# Patient Record
Sex: Male | Born: 1995 | Race: Black or African American | Hispanic: No | Marital: Single
Health system: Southern US, Community
[De-identification: ages and names within clinical notes are randomized; demographics above are authoritative.]

## PROBLEM LIST (undated history)

## (undated) HISTORY — PX: OTHER SURGICAL HISTORY: SHX169

---
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
## 2016-05-26 ENCOUNTER — Encounter (INDEPENDENT_AMBULATORY_CARE_PROVIDER_SITE_OTHER): Payer: Self-pay | Admitting: Physician Assistant

## 2016-05-26 ENCOUNTER — Ambulatory Visit (INDEPENDENT_AMBULATORY_CARE_PROVIDER_SITE_OTHER): Payer: Self-pay | Admitting: Physician Assistant

## 2016-05-26 ENCOUNTER — Other Ambulatory Visit (INDEPENDENT_AMBULATORY_CARE_PROVIDER_SITE_OTHER): Payer: Self-pay | Admitting: Physician Assistant

## 2016-05-26 VITALS — BP 103/66 | HR 53 | Temp 98.1°F | Ht 65.0 in | Wt 128.6 lb

## 2016-05-26 DIAGNOSIS — Z0000 Encounter for general adult medical examination without abnormal findings: Secondary | ICD-10-CM

## 2016-05-26 NOTE — Progress Notes (Signed)
  Subjective:  Patient ID: Warren Reyes, male    DOB: 1995/08/30  Age: 21 y.o. MRN: [id number]  CC:   HPI Warren Reyes is a 21 y.o. male with no significant PMH presents for a full physical exam. Feels well, want to be proactive about his health. No complaints.     Review of Systems  Constitutional: Negative for chills, fever and malaise/fatigue.  Eyes: Negative for blurred vision.  Respiratory: Negative for shortness of breath.   Cardiovascular: Negative for chest pain and palpitations.  Gastrointestinal: Negative for abdominal pain and nausea.  Genitourinary: Negative for dysuria and hematuria.  Musculoskeletal: Negative for joint pain and myalgias.  Skin: Negative for rash.  Neurological: Negative for tingling and headaches.  Psychiatric/Behavioral: Negative for depression. The patient is not nervous/anxious.     Objective:  BP 103/66   Pulse (!) 53   Temp 98.1 F (36.7 C) (Oral)   Ht  (1.651 m)   Wt 128 lb 9.6 oz (58.3 kg)   SpO2 98%   BMI 21.40 kg/m   BP/Weight 05/26/2016 05/07/2013 09/02/2012  Systolic BP 103 116 112  Diastolic BP 66 69 94  Wt. (Lbs) 128.6 - -  BMI 21.4 - -      Physical Exam  Constitutional: He is oriented to person, place, and time.  Well developed,thin, NAD, polite  HENT:  Head: Normocephalic and atraumatic.  Eyes: Pupils are equal, round, and reactive to light. No scleral icterus.  Neck: Normal range of motion. Neck supple. No thyromegaly present.  Cardiovascular: Normal rate, regular rhythm and normal heart sounds.   Pulmonary/Chest: Effort normal and breath sounds normal.  Abdominal: Soft. Bowel sounds are normal. He exhibits no distension and no mass. There is no tenderness. There is no rebound and no guarding.  Genitourinary: Penis normal.  Genitourinary Comments: Testicles normal, no hernia  Musculoskeletal: He exhibits no edema.  Full aROM throughout.   Lymphadenopathy:    He has no cervical adenopathy.   Neurological: He is alert and oriented to person, place, and time. He has normal reflexes. No cranial nerve deficit. Coordination normal.  Strength 5/5 throughout  Skin: Skin is warm and dry. No rash noted. No erythema. No pallor.  Psychiatric: He has a normal mood and affect. His behavior is normal. Thought content normal.  Vitals reviewed.    Assessment & Plan:   1. Annual physical exam - CBC with Differential - Comprehensive metabolic panel     Follow-up: Return in about 1 year (around 05/26/2017) for physical exam.   Loletta Specter PA

## 2016-05-27 LAB — CBC WITH DIFFERENTIAL/PLATELET
BASOS ABS: 0 10*3/uL (ref 0.0–0.2)
Basos: 1 %
EOS (ABSOLUTE): 0.3 10*3/uL (ref 0.0–0.4)
EOS: 6 %
HEMATOCRIT: 49.4 % (ref 37.5–51.0)
HEMOGLOBIN: 16.9 g/dL (ref 13.0–17.7)
Immature Grans (Abs): 0 10*3/uL (ref 0.0–0.1)
Immature Granulocytes: 0 %
LYMPHS: 29 %
Lymphocytes Absolute: 1.5 10*3/uL (ref 0.7–3.1)
MCH: 29.5 pg (ref 26.6–33.0)
MCHC: 34.2 g/dL (ref 31.5–35.7)
MCV: 86 fL (ref 79–97)
MONOCYTES: 7 %
Monocytes Absolute: 0.4 10*3/uL (ref 0.1–0.9)
NEUTROS PCT: 57 %
Neutrophils Absolute: 2.9 10*3/uL (ref 1.4–7.0)
Platelets: 191 10*3/uL (ref 150–379)
RBC: 5.73 x10E6/uL (ref 4.14–5.80)
RDW: 12.4 % (ref 12.3–15.4)
WBC: 5.1 10*3/uL (ref 3.4–10.8)

## 2016-05-27 LAB — COMPREHENSIVE METABOLIC PANEL
A/G RATIO: 1.6 (ref 1.2–2.2)
ALT: 15 IU/L (ref 0–44)
AST: 19 IU/L (ref 0–40)
Albumin: 5.1 g/dL (ref 3.5–5.5)
Alkaline Phosphatase: 78 IU/L (ref 39–117)
BUN/Creatinine Ratio: 7 — ABNORMAL LOW (ref 9–20)
BUN: 8 mg/dL (ref 6–20)
Bilirubin Total: 0.7 mg/dL (ref 0.0–1.2)
CALCIUM: 9.9 mg/dL (ref 8.7–10.2)
CO2: 25 mmol/L (ref 18–29)
CREATININE: 1.08 mg/dL (ref 0.76–1.27)
Chloride: 101 mmol/L (ref 96–106)
GFR, EST AFRICAN AMERICAN: 112 mL/min/{1.73_m2} (ref >59)
GFR, EST NON AFRICAN AMERICAN: 97 mL/min/{1.73_m2} (ref >59)
GLOBULIN, TOTAL: 3.2 g/dL (ref 1.5–4.5)
GLUCOSE: 90 mg/dL (ref 65–99)
Potassium: 4.1 mmol/L (ref 3.5–5.2)
Sodium: 141 mmol/L (ref 134–144)
Total Protein: 8.3 g/dL (ref 6.0–8.5)

## 2016-07-29 ENCOUNTER — Encounter (HOSPITAL_COMMUNITY): Payer: Self-pay | Admitting: Emergency Medicine

## 2016-07-29 ENCOUNTER — Emergency Department (HOSPITAL_COMMUNITY)
Admission: EM | Admit: 2016-07-29 | Discharge: 2016-07-29 | Disposition: A | Discharge status: Home / Self Care | Payer: Self-pay | Attending: Emergency Medicine | Admitting: Emergency Medicine

## 2016-07-29 DIAGNOSIS — F17290 Nicotine dependence, other tobacco product, uncomplicated: Secondary | ICD-10-CM | POA: Insufficient documentation

## 2016-07-29 DIAGNOSIS — B354 Tinea corporis: Secondary | ICD-10-CM | POA: Insufficient documentation

## 2016-07-29 MED ORDER — HYDROXYZINE HCL 25 MG PO TABS
50.0000 mg | ORAL_TABLET | Freq: Once | ORAL | Status: AC
  Administered 2016-07-29: 50 mg via ORAL
  Filled 2016-07-29: qty 2

## 2016-07-29 MED ORDER — CLOTRIMAZOLE 1 % EX CREA: TOPICAL_CREAM | CUTANEOUS | 2 refills | Status: AC

## 2016-07-29 NOTE — ED Provider Notes (Signed)
[facility]-EMERGENCY DEPT Provider Note   CSN: [id number] Arrival date & time: 07/29/16  1227  By signing my name below, I, Thelma Barge, attest that this documentation has been prepared under the direction and in the presence of [employer], PA-C. Electronically Signed: Thelma Barge, Scribe. 07/29/16. 2:09 PM  History   Chief Complaint Chief Complaint  Patient presents with  . Rash  The history is provided by the patient. No language interpreter was used.    HPI Comments: Warren Reyes is a 21 y.o. male who presents to the Emergency Department complaining of constant, gradually worsening generalized, itchy body rashes that is spreading to his arms for 3 days. He has tried using rubbing alcohol with no relief. He denies being around anyone else with his symptoms.Denies new medications, food, soap, lotions, laundry detergent, pets, N/V, dyspepsia, CP, SOB, wheeze, lip or tongue swelling, fever, h/o anaphylactic reaction.    History reviewed. No pertinent past medical history.  There are no active problems to display for this patient.   Past Surgical History:  Procedure Laterality Date  . extraction of wisdom teeth          Home Medications    Prior to Admission medications   Medication Sig Start Date End Date Taking? Authorizing Provider  clotrimazole (LOTRIMIN) 1 % cream Apply to affected area 2 times daily for at least 4 weeks ( for 1 week until after the lesions have healed) 07/29/16   Giabella Duhart, Joni Reining, PA-C  ibuprofen (ADVIL,MOTRIN) 600 MG tablet Take 1 tablet (600 mg total) by mouth every 8 (eight) hours as needed for pain. Patient not taking: Reported on 05/26/2016 09/02/12   Earley Favor, NP  tretinoin (RETIN-A) 0.025 % cream Apply topically at bedtime. Patient not taking: Reported on 05/26/2016 08/29/12   Sheran Luz, MD    Family History Family History  Problem Relation Age of Onset  . Hypertension Mother   . Diabetes Mother   . Cancer Mother   . Asthma  Mother     Social History Social History  Substance Use Topics  . Smoking status: Current Every Day Smoker    Types: Cigars  . Smokeless tobacco: Never Used  . Alcohol use No     Allergies   Patient has no known allergies.   Review of Systems Review of Systems A complete 10 system review of systems was obtained and all systems are negative except as noted in the HPI and PMH.    Physical Exam Updated Vital Signs BP 123/79 (BP Location: Right Arm)   Pulse 62   Temp 98.8 F (37.1 C) (Oral)   Resp 17   SpO2 100%   Physical Exam  Constitutional: He is oriented to person, place, and time. He appears well-developed and well-nourished. No distress.  HENT:  Head: Normocephalic and atraumatic.  Mouth/Throat: Oropharynx is clear and moist.  Eyes: Conjunctivae and EOM are normal. Pupils are equal, round, and reactive to light.  Neck: Normal range of motion.  Cardiovascular: Normal rate, regular rhythm and intact distal pulses.   Pulmonary/Chest: Effort normal and breath sounds normal.  Abdominal: Soft. There is no tenderness.  Musculoskeletal: Normal range of motion.  Neurological: He is alert and oriented to person, place, and time.  Skin: Skin is warm and dry. Rash noted. He is not diaphoretic.  Raised annular lesions proximally 1 center in diameter scattered over torso and anterior and posterior side if lesions have central clearing. No warmth, tenderness to palpation, lesions are blanchable and spare the  palms soles and mucous membranes.  Psychiatric: He has a normal mood and affect.  Nursing note and vitals reviewed.    ED Treatments / Results  DIAGNOSTIC STUDIES: Oxygen Saturation is 100% on RA, normal by my interpretation.    COORDINATION OF CARE: 2:09 PM Discussed treatment plan with pt at bedside and pt agreed to plan.  Labs (all labs ordered are listed, but only abnormal results are displayed) Labs Reviewed - No data to display  EKG  EKG  Interpretation None       Radiology No results found.  Procedures Procedures (including critical care time)  Medications Ordered in ED Medications  hydrOXYzine (ATARAX/VISTARIL) tablet 50 mg (50 mg Oral Given 07/29/16 1421)     Initial Impression / Assessment and Plan / ED Course  I have reviewed the triage vital signs and the nursing notes.  Pertinent labs & imaging results that were available during my care of the patient were reviewed by me and considered in my medical decision making (see chart for details).     Vitals:   07/29/16 1335  BP: 123/79  Pulse: 62  Resp: 17  Temp: 98.8 F (37.1 C)  TempSrc: Oral  SpO2: 100%    Medications  hydrOXYzine (ATARAX/VISTARIL) tablet 50 mg (50 mg Oral Given 07/29/16 1421)    [name] is 21 y.o. male presenting with pruritic rash consistent with tinea corporis. No red flags.  Evaluation does not show pathology that would require ongoing emergent intervention or inpatient treatment. Pt is hemodynamically stable and mentating appropriately. Discussed findings and plan with patient/guardian, who agrees with care plan. All questions answered. Return precautions discussed and outpatient follow up given.      Final Clinical Impressions(s) / ED Diagnoses   Final diagnoses:  Tinea corporis    New Prescriptions Discharge Medication List as of 07/29/2016  2:11 PM    START taking these medications   Details  clotrimazole (LOTRIMIN) 1 % cream Apply to affected area 2 times daily for at least 4 weeks ( for 1 week until after the lesions have healed), Print       I personally performed the services described in this documentation, which was scribed in my presence. The recorded information has been reviewed and is accurate.    Kaylyn Limisciotta, Lorette Peterkin, PA-C 07/29/16 1644    Arby BarrettePfeiffer, Marcy, MD 08/19/16 [phone]

## 2016-07-29 NOTE — Discharge Instructions (Signed)
Please follow with your primary care doctor in the next 2 days for a check-up. They must obtain records for further management.  ° °Do not hesitate to return to the Emergency Department for any new, worsening or concerning symptoms.  ° °

## 2016-07-29 NOTE — ED Triage Notes (Signed)
patient c/o rash that started couple days ago on torso and has spread to his back, arms and legs. Patient reports that he did use new soap.

## 2016-11-03 ENCOUNTER — Ambulatory Visit (INDEPENDENT_AMBULATORY_CARE_PROVIDER_SITE_OTHER): Payer: Self-pay | Admitting: Physician Assistant

## 2020-09-06 ENCOUNTER — Emergency Department (HOSPITAL_COMMUNITY): Payer: No Typology Code available for payment source

## 2020-09-06 ENCOUNTER — Encounter (HOSPITAL_COMMUNITY): Admission: EM | Disposition: A | Discharge status: Home / Self Care | Payer: Self-pay | Source: Home / Self Care

## 2020-09-06 ENCOUNTER — Emergency Department (HOSPITAL_COMMUNITY): Payer: Self-pay

## 2020-09-06 ENCOUNTER — Emergency Department (HOSPITAL_COMMUNITY): Payer: No Typology Code available for payment source | Admitting: Anesthesiology

## 2020-09-06 ENCOUNTER — Inpatient Hospital Stay (HOSPITAL_COMMUNITY)
Admission: EM | Admit: 2020-09-06 | Discharge: 2020-09-10 | DRG: 957 | Disposition: A | Discharge status: Home / Self Care | Payer: No Typology Code available for payment source | Attending: Surgery | Admitting: Surgery

## 2020-09-06 DIAGNOSIS — Z419 Encounter for procedure for purposes other than remedying health state, unspecified: Secondary | ICD-10-CM

## 2020-09-06 DIAGNOSIS — S0292XA Unspecified fracture of facial bones, initial encounter for closed fracture: Secondary | ICD-10-CM

## 2020-09-06 DIAGNOSIS — S36039A Unspecified laceration of spleen, initial encounter: Secondary | ICD-10-CM

## 2020-09-06 DIAGNOSIS — S2249XA Multiple fractures of ribs, unspecified side, initial encounter for closed fracture: Secondary | ICD-10-CM

## 2020-09-06 DIAGNOSIS — T1490XA Injury, unspecified, initial encounter: Secondary | ICD-10-CM

## 2020-09-06 DIAGNOSIS — T148XXA Other injury of unspecified body region, initial encounter: Secondary | ICD-10-CM

## 2020-09-06 DIAGNOSIS — Z9889 Other specified postprocedural states: Secondary | ICD-10-CM

## 2020-09-06 DIAGNOSIS — S36113A Laceration of liver, unspecified degree, initial encounter: Secondary | ICD-10-CM

## 2020-09-06 DIAGNOSIS — R52 Pain, unspecified: Secondary | ICD-10-CM

## 2020-09-06 DIAGNOSIS — S27329A Contusion of lung, unspecified, initial encounter: Secondary | ICD-10-CM

## 2020-09-06 DIAGNOSIS — T07XXXA Unspecified multiple injuries, initial encounter: Secondary | ICD-10-CM

## 2020-09-06 DIAGNOSIS — S27321A Contusion of lung, unilateral, initial encounter: Secondary | ICD-10-CM | POA: Diagnosis present

## 2020-09-06 DIAGNOSIS — S32412A Displaced fracture of anterior wall of left acetabulum, initial encounter for closed fracture: Secondary | ICD-10-CM | POA: Diagnosis present

## 2020-09-06 DIAGNOSIS — S52502A Unspecified fracture of the lower end of left radius, initial encounter for closed fracture: Secondary | ICD-10-CM | POA: Diagnosis present

## 2020-09-06 DIAGNOSIS — S32592A Other specified fracture of left pubis, initial encounter for closed fracture: Secondary | ICD-10-CM | POA: Diagnosis present

## 2020-09-06 DIAGNOSIS — R40241 Glasgow coma scale score 13-15, unspecified time: Secondary | ICD-10-CM | POA: Diagnosis present

## 2020-09-06 DIAGNOSIS — S0285XA Fracture of orbit, unspecified, initial encounter for closed fracture: Secondary | ICD-10-CM | POA: Diagnosis present

## 2020-09-06 DIAGNOSIS — S3282XA Multiple fractures of pelvis without disruption of pelvic ring, initial encounter for closed fracture: Secondary | ICD-10-CM | POA: Diagnosis present

## 2020-09-06 DIAGNOSIS — S52612A Displaced fracture of left ulna styloid process, initial encounter for closed fracture: Secondary | ICD-10-CM | POA: Diagnosis present

## 2020-09-06 DIAGNOSIS — S2242XA Multiple fractures of ribs, left side, initial encounter for closed fracture: Secondary | ICD-10-CM | POA: Diagnosis present

## 2020-09-06 DIAGNOSIS — S06369A Traumatic hemorrhage of cerebrum, unspecified, with loss of consciousness of unspecified duration, initial encounter: Secondary | ICD-10-CM | POA: Diagnosis present

## 2020-09-06 DIAGNOSIS — S0219XA Other fracture of base of skull, initial encounter for closed fracture: Secondary | ICD-10-CM | POA: Diagnosis present

## 2020-09-06 DIAGNOSIS — Y92410 Unspecified street and highway as the place of occurrence of the external cause: Secondary | ICD-10-CM

## 2020-09-06 DIAGNOSIS — D62 Acute posthemorrhagic anemia: Secondary | ICD-10-CM | POA: Diagnosis present

## 2020-09-06 DIAGNOSIS — Z23 Encounter for immunization: Secondary | ICD-10-CM

## 2020-09-06 DIAGNOSIS — U071 COVID-19: Secondary | ICD-10-CM | POA: Diagnosis present

## 2020-09-06 DIAGNOSIS — I959 Hypotension, unspecified: Secondary | ICD-10-CM | POA: Diagnosis present

## 2020-09-06 DIAGNOSIS — S81812A Laceration without foreign body, left lower leg, initial encounter: Secondary | ICD-10-CM | POA: Diagnosis present

## 2020-09-06 DIAGNOSIS — E876 Hypokalemia: Secondary | ICD-10-CM | POA: Diagnosis not present

## 2020-09-06 DIAGNOSIS — K920 Hematemesis: Secondary | ICD-10-CM | POA: Diagnosis present

## 2020-09-06 DIAGNOSIS — S36032A Major laceration of spleen, initial encounter: Secondary | ICD-10-CM | POA: Diagnosis present

## 2020-09-06 HISTORY — PX: LAPAROTOMY: SHX154

## 2020-09-06 HISTORY — PX: LACERATION REPAIR: SHX5168

## 2020-09-06 LAB — CBC
HCT: 42.8 % (ref 39.0–52.0)
Hemoglobin: 15.1 g/dL (ref 13.0–17.0)
MCH: 30.1 pg (ref 26.0–34.0)
MCHC: 35.3 g/dL (ref 30.0–36.0)
MCV: 85.3 fL (ref 80.0–100.0)
Platelets: UNDETERMINED 10*3/uL (ref 150–400)
RBC: 5.02 MIL/uL (ref 4.22–5.81)
RDW: 11.5 % (ref 11.5–15.5)
WBC: 6 10*3/uL (ref 4.0–10.5)
nRBC: 0.5 % — ABNORMAL HIGH (ref 0.0–0.2)

## 2020-09-06 LAB — PROTIME-INR
INR: 1.2 (ref 0.8–1.2)
Prothrombin Time: 14.9 seconds (ref 11.4–15.2)

## 2020-09-06 LAB — I-STAT CHEM 8, ED
BUN: 21 mg/dL — ABNORMAL HIGH (ref 6–20)
Calcium, Ion: 1.04 mmol/L — ABNORMAL LOW (ref 1.15–1.40)
Chloride: 104 mmol/L (ref 98–111)
Creatinine, Ser: 1.2 mg/dL (ref 0.61–1.24)
Glucose, Bld: 150 mg/dL — ABNORMAL HIGH (ref 70–99)
HCT: 44 % (ref 39.0–52.0)
Hemoglobin: 15 g/dL (ref 13.0–17.0)
Potassium: 3.8 mmol/L (ref 3.5–5.1)
Sodium: 140 mmol/L (ref 135–145)
TCO2: 25 mmol/L (ref 22–32)

## 2020-09-06 LAB — COMPREHENSIVE METABOLIC PANEL
ALT: 58 U/L — ABNORMAL HIGH (ref 0–44)
AST: 101 U/L — ABNORMAL HIGH (ref 15–41)
Albumin: 4 g/dL (ref 3.5–5.0)
Alkaline Phosphatase: 64 U/L (ref 38–126)
Anion gap: 10 (ref 5–15)
BUN: 17 mg/dL (ref 6–20)
CO2: 21 mmol/L — ABNORMAL LOW (ref 22–32)
Calcium: 8.7 mg/dL — ABNORMAL LOW (ref 8.9–10.3)
Chloride: 105 mmol/L (ref 98–111)
Creatinine, Ser: 1.29 mg/dL — ABNORMAL HIGH (ref 0.61–1.24)
GFR, Estimated: >60 mL/min (ref >60)
Glucose, Bld: 155 mg/dL — ABNORMAL HIGH (ref 70–99)
Potassium: 3.9 mmol/L (ref 3.5–5.1)
Sodium: 136 mmol/L (ref 135–145)
Total Bilirubin: 1.1 mg/dL (ref 0.3–1.2)
Total Protein: 7.1 g/dL (ref 6.5–8.1)

## 2020-09-06 LAB — ETHANOL: Alcohol, Ethyl (B): <10 mg/dL (ref <10)

## 2020-09-06 LAB — ABO/RH: ABO/RH(D): A POS

## 2020-09-06 SURGERY — LAPAROTOMY, EXPLORATORY
Anesthesia: General | Site: Leg Lower

## 2020-09-06 MED ORDER — PROPOFOL 10 MG/ML IV BOLUS
INTRAVENOUS | Status: AC
  Filled 2020-09-06: qty 40

## 2020-09-06 MED ORDER — ALBUMIN HUMAN 5 % IV SOLN: INTRAVENOUS | Status: DC | PRN

## 2020-09-06 MED ORDER — LACTATED RINGERS IV SOLN: INTRAVENOUS | Status: DC | PRN

## 2020-09-06 MED ORDER — SODIUM CHLORIDE 0.9 % IV SOLN
INTRAVENOUS | Status: AC | PRN
  Administered 2020-09-06: 999 mL/h via INTRAVENOUS

## 2020-09-06 MED ORDER — MIDAZOLAM HCL 2 MG/2ML IJ SOLN
INTRAMUSCULAR | Status: AC
  Filled 2020-09-06: qty 2

## 2020-09-06 MED ORDER — PROPOFOL 10 MG/ML IV BOLUS
INTRAVENOUS | Status: DC | PRN
  Administered 2020-09-06: 120 mg via INTRAVENOUS

## 2020-09-06 MED ORDER — SODIUM CHLORIDE 0.9 % IV SOLN
INTRAVENOUS | Status: AC
  Filled 2020-09-06: qty 2

## 2020-09-06 MED ORDER — ROCURONIUM BROMIDE 10 MG/ML (PF) SYRINGE
PREFILLED_SYRINGE | INTRAVENOUS | Status: DC | PRN
  Administered 2020-09-06: 50 mg via INTRAVENOUS

## 2020-09-06 MED ORDER — SUCCINYLCHOLINE CHLORIDE 200 MG/10ML IV SOSY
PREFILLED_SYRINGE | INTRAVENOUS | Status: DC | PRN
  Administered 2020-09-06: 120 mg via INTRAVENOUS

## 2020-09-06 MED ORDER — FENTANYL CITRATE (PF) 100 MCG/2ML IJ SOLN
INTRAMUSCULAR | Status: DC | PRN
  Administered 2020-09-06 (×2): 50 ug via INTRAVENOUS
  Administered 2020-09-06: 150 ug via INTRAVENOUS

## 2020-09-06 MED ORDER — SODIUM CHLORIDE 0.9 % IV SOLN
INTRAVENOUS | Status: DC | PRN
  Administered 2020-09-06: 2 g via INTRAVENOUS

## 2020-09-06 MED ORDER — SODIUM CHLORIDE 0.9 % IV SOLN: INTRAVENOUS | Status: DC | PRN

## 2020-09-06 MED ORDER — LIDOCAINE 2% (20 MG/ML) 5 ML SYRINGE
INTRAMUSCULAR | Status: DC | PRN
  Administered 2020-09-06: 40 mg via INTRAVENOUS

## 2020-09-06 MED ORDER — TETANUS-DIPHTH-ACELL PERTUSSIS 5-2.5-18.5 LF-MCG/0.5 IM SUSY
0.5000 mL | PREFILLED_SYRINGE | Freq: Once | INTRAMUSCULAR | Status: AC
  Administered 2020-09-06: 0.5 mL via INTRAMUSCULAR

## 2020-09-06 MED ORDER — IOHEXOL 350 MG/ML SOLN
100.0000 mL | Freq: Once | INTRAVENOUS | Status: AC | PRN
  Administered 2020-09-06: 100 mL via INTRAVENOUS

## 2020-09-06 MED ORDER — FENTANYL CITRATE (PF) 250 MCG/5ML IJ SOLN
INTRAMUSCULAR | Status: AC
  Filled 2020-09-06: qty 5

## 2020-09-06 MED ORDER — 0.9 % SODIUM CHLORIDE (POUR BTL) OPTIME
TOPICAL | Status: DC | PRN
  Administered 2020-09-06: 2000 mL
  Administered 2020-09-06: 1000 mL

## 2020-09-06 SURGICAL SUPPLY — 49 items
APL PRP STRL LF DISP 70% ISPRP (MISCELLANEOUS) ×2
BLADE CLIPPER SURG (BLADE) IMPLANT
BNDG GAUZE ELAST 4 BULKY (GAUZE/BANDAGES/DRESSINGS) ×1 IMPLANT
CANISTER SUCT 3000ML PPV (MISCELLANEOUS) ×3 IMPLANT
CHLORAPREP W/TINT 26 (MISCELLANEOUS) ×3 IMPLANT
COVER SURGICAL LIGHT HANDLE (MISCELLANEOUS) ×3 IMPLANT
DRAPE LAPAROSCOPIC ABDOMINAL (DRAPES) ×3 IMPLANT
DRAPE WARM FLUID 44X44 (DRAPES) ×3 IMPLANT
DRSG COVADERM 4X6 (GAUZE/BANDAGES/DRESSINGS) ×1 IMPLANT
DRSG MEPITEL 4X7.2 (GAUZE/BANDAGES/DRESSINGS) ×1 IMPLANT
DRSG OPSITE POSTOP 4X10 (GAUZE/BANDAGES/DRESSINGS) ×1 IMPLANT
DRSG OPSITE POSTOP 4X8 (GAUZE/BANDAGES/DRESSINGS) IMPLANT
DRSG PAD ABDOMINAL 8X10 ST (GAUZE/BANDAGES/DRESSINGS) ×1 IMPLANT
ELECT BLADE 6.5 EXT (BLADE) IMPLANT
ELECT CAUTERY BLADE 6.4 (BLADE) ×3 IMPLANT
ELECT REM PT RETURN 9FT ADLT (ELECTROSURGICAL) ×3
ELECTRODE REM PT RTRN 9FT ADLT (ELECTROSURGICAL) ×2 IMPLANT
GAUZE SPONGE 4X4 12PLY STRL (GAUZE/BANDAGES/DRESSINGS) ×1 IMPLANT
GLOVE SRG 8 PF TXTR STRL LF DI (GLOVE) ×2 IMPLANT
GLOVE SURG ENC MOIS LTX SZ7.5 (GLOVE) ×3 IMPLANT
GLOVE SURG PR MICRO ENCORE 7 (GLOVE) ×2 IMPLANT
GLOVE SURG UNDER POLY LF SZ6 (GLOVE) ×1 IMPLANT
GLOVE SURG UNDER POLY LF SZ8 (GLOVE) ×6
GOWN STRL REUS W/ TWL LRG LVL3 (GOWN DISPOSABLE) ×2 IMPLANT
GOWN STRL REUS W/ TWL XL LVL3 (GOWN DISPOSABLE) ×2 IMPLANT
GOWN STRL REUS W/TWL LRG LVL3 (GOWN DISPOSABLE) ×3
GOWN STRL REUS W/TWL XL LVL3 (GOWN DISPOSABLE) ×3
HANDLE SUCTION POOLE (INSTRUMENTS) ×2 IMPLANT
KIT BASIN OR (CUSTOM PROCEDURE TRAY) ×3 IMPLANT
KIT TURNOVER KIT B (KITS) ×3 IMPLANT
LIGASURE IMPACT 36 18CM CVD LR (INSTRUMENTS) IMPLANT
NS IRRIG 1000ML POUR BTL (IV SOLUTION) ×6 IMPLANT
PACK GENERAL/GYN (CUSTOM PROCEDURE TRAY) ×3 IMPLANT
PAD ARMBOARD 7.5X6 YLW CONV (MISCELLANEOUS) ×3 IMPLANT
PENCIL SMOKE EVACUATOR (MISCELLANEOUS) ×3 IMPLANT
SPECIMEN JAR LARGE (MISCELLANEOUS) IMPLANT
SPONGE T-LAP 18X18 ~~LOC~~+RFID (SPONGE) ×7 IMPLANT
STAPLER VISISTAT 35W (STAPLE) ×3 IMPLANT
SUCTION POOLE HANDLE (INSTRUMENTS) ×3
SUT ETHILON 3 0 FSL (SUTURE) ×1 IMPLANT
SUT PDS AB 1 TP1 54 (SUTURE) ×6 IMPLANT
SUT PDS AB 1 TP1 96 (SUTURE) ×2 IMPLANT
SUT SILK 2 0 SH CR/8 (SUTURE) ×3 IMPLANT
SUT SILK 2 0 TIES 10X30 (SUTURE) ×3 IMPLANT
SUT SILK 3 0 SH CR/8 (SUTURE) ×3 IMPLANT
SUT SILK 3 0 TIES 10X30 (SUTURE) ×3 IMPLANT
TOWEL GREEN STERILE (TOWEL DISPOSABLE) ×3 IMPLANT
TRAY FOLEY MTR SLVR 16FR STAT (SET/KITS/TRAYS/PACK) ×3 IMPLANT
YANKAUER SUCT BULB TIP NO VENT (SUCTIONS) ×1 IMPLANT

## 2020-09-06 NOTE — ED Notes (Signed)
Patient transported to CT 
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
## 2020-09-06 NOTE — ED Notes (Signed)
Bedside portable XRays

## 2020-09-06 NOTE — Anesthesia Procedure Notes (Signed)
Arterial Line Insertion Start/End7/31/2022 10:55 PM, 09/06/2020 11:00 PM Performed by: Molli Hazard, CRNA, CRNA  Right, radial was placed Catheter size: 20 G Hand hygiene performed  and maximum sterile barriers used   Attempts: 1 Procedure performed without using ultrasound guided technique. Following insertion, Biopatch and dressing applied. Post procedure assessment: normal  Patient tolerated the procedure well with no immediate complications.
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
## 2020-09-06 NOTE — Anesthesia Procedure Notes (Addendum)
Procedure Name: Intubation Date/Time: 09/06/2020 10:48 PM Performed by: Edmonia Caprio, CRNA Pre-anesthesia Checklist: Patient identified, Emergency Drugs available, Suction available and Patient being monitored Patient Re-evaluated:Patient Re-evaluated prior to induction Oxygen Delivery Method: Circle system utilized Preoxygenation: Pre-oxygenation with 100% oxygen Induction Type: IV induction Ventilation: Mask ventilation without difficulty Laryngoscope Size: Glidescope and 3 Grade View: Grade I Tube type: Oral Tube size: 7.5 mm Number of attempts: 1 Airway Equipment and Method: Stylet and Video-laryngoscopy Placement Confirmation: ETT inserted through vocal cords under direct vision, positive ETCO2 and breath sounds checked- equal and bilateral Secured at: 23 cm Tube secured with: Tape Dental Injury: Teeth and Oropharynx as per pre-operative assessment  Difficulty Due To: Difficult Airway- due to reduced neck mobility Comments: C-Collar remained on for induction, intubation and surgery.
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
## 2020-09-07 ENCOUNTER — Encounter (HOSPITAL_COMMUNITY): Payer: Self-pay | Admitting: General Surgery

## 2020-09-07 ENCOUNTER — Inpatient Hospital Stay (HOSPITAL_COMMUNITY): Payer: No Typology Code available for payment source

## 2020-09-07 DIAGNOSIS — Z9889 Other specified postprocedural states: Secondary | ICD-10-CM

## 2020-09-07 LAB — BPAM FFP
Blood Product Expiration Date: 202207312359
Blood Product Expiration Date: 202207312359
Blood Product Expiration Date: 202207312359
Blood Product Expiration Date: 202208042359
Blood Product Expiration Date: 202208062359
Blood Product Expiration Date: 202208072359
ISSUE DATE / TIME: 202207311917
ISSUE DATE / TIME: 202207311943
ISSUE DATE / TIME: 202207312255
ISSUE DATE / TIME: 202207312255
ISSUE DATE / TIME: 202207312255
ISSUE DATE / TIME: 202207312255
Unit Type and Rh: 6200
Unit Type and Rh: 6200
Unit Type and Rh: 6200
Unit Type and Rh: 6200
Unit Type and Rh: 6200
Unit Type and Rh: 6200

## 2020-09-07 LAB — CBC
HCT: 30.1 % — ABNORMAL LOW (ref 39.0–52.0)
Hemoglobin: 10.4 g/dL — ABNORMAL LOW (ref 13.0–17.0)
MCH: 30.5 pg (ref 26.0–34.0)
MCHC: 34.6 g/dL (ref 30.0–36.0)
MCV: 88.3 fL (ref 80.0–100.0)
Platelets: 73 10*3/uL — ABNORMAL LOW (ref 150–400)
RBC: 3.41 MIL/uL — ABNORMAL LOW (ref 4.22–5.81)
RDW: 12.5 % (ref 11.5–15.5)
WBC: 21.7 10*3/uL — ABNORMAL HIGH (ref 4.0–10.5)
nRBC: 0.1 % (ref 0.0–0.2)

## 2020-09-07 LAB — PREPARE FRESH FROZEN PLASMA
Unit division: 0
Unit division: 0
Unit division: 0
Unit division: 0
Unit division: 0

## 2020-09-07 LAB — URINALYSIS, ROUTINE W REFLEX MICROSCOPIC
Bacteria, UA: NONE SEEN
Bilirubin Urine: NEGATIVE
Glucose, UA: NEGATIVE mg/dL
Ketones, ur: 5 mg/dL — AB
Nitrite: NEGATIVE
Protein, ur: NEGATIVE mg/dL
Specific Gravity, Urine: 1.034 — ABNORMAL HIGH (ref 1.005–1.030)
pH: 5 (ref 5.0–8.0)

## 2020-09-07 LAB — BASIC METABOLIC PANEL
Anion gap: 9 (ref 5–15)
BUN: 15 mg/dL (ref 6–20)
CO2: 24 mmol/L (ref 22–32)
Calcium: 7.8 mg/dL — ABNORMAL LOW (ref 8.9–10.3)
Chloride: 108 mmol/L (ref 98–111)
Creatinine, Ser: 1.09 mg/dL (ref 0.61–1.24)
GFR, Estimated: >60 mL/min (ref >60)
Glucose, Bld: 137 mg/dL — ABNORMAL HIGH (ref 70–99)
Potassium: 4.1 mmol/L (ref 3.5–5.1)
Sodium: 141 mmol/L (ref 135–145)

## 2020-09-07 LAB — RESP PANEL BY RT-PCR (FLU A&B, COVID) ARPGX2
Influenza A by PCR: NEGATIVE
Influenza B by PCR: NEGATIVE
SARS Coronavirus 2 by RT PCR: POSITIVE — AB

## 2020-09-07 LAB — POCT I-STAT 7, (LYTES, BLD GAS, ICA,H+H)
Acid-Base Excess: 1 mmol/L (ref 0.0–2.0)
Acid-base deficit: 1 mmol/L (ref 0.0–2.0)
Bicarbonate: 23.8 mmol/L (ref 20.0–28.0)
Bicarbonate: 26 mmol/L (ref 20.0–28.0)
Calcium, Ion: 0.96 mmol/L — ABNORMAL LOW (ref 1.15–1.40)
Calcium, Ion: 0.98 mmol/L — ABNORMAL LOW (ref 1.15–1.40)
HCT: 27 % — ABNORMAL LOW (ref 39.0–52.0)
HCT: 31 % — ABNORMAL LOW (ref 39.0–52.0)
Hemoglobin: 10.5 g/dL — ABNORMAL LOW (ref 13.0–17.0)
Hemoglobin: 9.2 g/dL — ABNORMAL LOW (ref 13.0–17.0)
O2 Saturation: 100 %
O2 Saturation: 100 %
Potassium: 4.3 mmol/L (ref 3.5–5.1)
Potassium: 4.4 mmol/L (ref 3.5–5.1)
Sodium: 140 mmol/L (ref 135–145)
Sodium: 142 mmol/L (ref 135–145)
TCO2: 25 mmol/L (ref 22–32)
TCO2: 27 mmol/L (ref 22–32)
pCO2 arterial: 36.8 mmHg (ref 32.0–48.0)
pCO2 arterial: 44.6 mmHg (ref 32.0–48.0)
pH, Arterial: 7.374 (ref 7.350–7.450)
pH, Arterial: 7.419 (ref 7.350–7.450)
pO2, Arterial: 262 mmHg — ABNORMAL HIGH (ref 83.0–108.0)
pO2, Arterial: 517 mmHg — ABNORMAL HIGH (ref 83.0–108.0)

## 2020-09-07 LAB — HIV ANTIBODY (ROUTINE TESTING W REFLEX): HIV Screen 4th Generation wRfx: NONREACTIVE

## 2020-09-07 LAB — LACTIC ACID, PLASMA: Lactic Acid, Venous: 0.9 mmol/L (ref 0.5–1.9)

## 2020-09-07 LAB — BLOOD PRODUCT ORDER (VERBAL) VERIFICATION

## 2020-09-07 MED ORDER — PROPOFOL 10 MG/ML IV BOLUS
INTRAVENOUS | Status: AC
  Filled 2020-09-07: qty 20

## 2020-09-07 MED ORDER — KETOROLAC TROMETHAMINE 15 MG/ML IJ SOLN
30.0000 mg | Freq: Four times a day (QID) | INTRAMUSCULAR | Status: DC
Start: 2020-09-07 — End: 2020-09-09
  Administered 2020-09-07 – 2020-09-09 (×7): 30 mg via INTRAVENOUS
  Filled 2020-09-07 (×7): qty 2

## 2020-09-07 MED ORDER — METHOCARBAMOL 500 MG PO TABS
1000.0000 mg | ORAL_TABLET | Freq: Three times a day (TID) | ORAL | Status: DC
  Administered 2020-09-07 – 2020-09-10 (×9): 1000 mg via ORAL
  Filled 2020-09-07 (×9): qty 2

## 2020-09-07 MED ORDER — OXYCODONE HCL 5 MG/5ML PO SOLN
5.0000 mg | ORAL | Status: DC | PRN
  Administered 2020-09-08 – 2020-09-10 (×2): 10 mg via ORAL
  Filled 2020-09-07 (×2): qty 10

## 2020-09-07 MED ORDER — HYDROMORPHONE 1 MG/ML IV SOLN
INTRAVENOUS | Status: AC
  Filled 2020-09-07: qty 30

## 2020-09-07 MED ORDER — HYDROMORPHONE HCL 1 MG/ML IJ SOLN: 0.5000 mg | INTRAMUSCULAR | Status: DC | PRN

## 2020-09-07 MED ORDER — DIPHENHYDRAMINE HCL 12.5 MG/5ML PO ELIX
12.5000 mg | ORAL_SOLUTION | Freq: Four times a day (QID) | ORAL | Status: DC | PRN
  Filled 2020-09-07: qty 5

## 2020-09-07 MED ORDER — SODIUM CHLORIDE 0.9 % IV SOLN: INTRAVENOUS | Status: DC

## 2020-09-07 MED ORDER — HYDROMORPHONE HCL 1 MG/ML IJ SOLN
0.2500 mg | INTRAMUSCULAR | Status: DC | PRN
  Administered 2020-09-07 (×2): 0.5 mg via INTRAVENOUS

## 2020-09-07 MED ORDER — WHITE PETROLATUM EX OINT
TOPICAL_OINTMENT | CUTANEOUS | Status: AC
  Administered 2020-09-07: 1
  Filled 2020-09-07: qty 28.35

## 2020-09-07 MED ORDER — CHLORHEXIDINE GLUCONATE CLOTH 2 % EX PADS
6.0000 | MEDICATED_PAD | Freq: Every day | CUTANEOUS | Status: DC
  Administered 2020-09-07 – 2020-09-09 (×3): 6 via TOPICAL

## 2020-09-07 MED ORDER — ENOXAPARIN SODIUM 30 MG/0.3ML IJ SOSY: 30.0000 mg | PREFILLED_SYRINGE | Freq: Two times a day (BID) | INTRAMUSCULAR | Status: DC

## 2020-09-07 MED ORDER — CIPROFLOXACIN-DEXAMETHASONE 0.3-0.1 % OT SUSP
4.0000 [drp] | Freq: Two times a day (BID) | OTIC | Status: DC
  Administered 2020-09-07 – 2020-09-10 (×7): 4 [drp] via OTIC
  Filled 2020-09-07: qty 7.5

## 2020-09-07 MED ORDER — MIDAZOLAM HCL 2 MG/2ML IJ SOLN: 0.5000 mg | Freq: Once | INTRAMUSCULAR | Status: DC | PRN

## 2020-09-07 MED ORDER — OXYCODONE HCL 5 MG/5ML PO SOLN: 5.0000 mg | Freq: Once | ORAL | Status: DC | PRN

## 2020-09-07 MED ORDER — ONDANSETRON 4 MG PO TBDP
4.0000 mg | ORAL_TABLET | Freq: Four times a day (QID) | ORAL | Status: DC | PRN
  Administered 2020-09-10: 4 mg via ORAL
  Filled 2020-09-07: qty 1

## 2020-09-07 MED ORDER — NALOXONE HCL 0.4 MG/ML IJ SOLN: 0.4000 mg | INTRAMUSCULAR | Status: DC | PRN

## 2020-09-07 MED ORDER — HYDROMORPHONE 1 MG/ML IV SOLN
INTRAVENOUS | Status: DC
Start: 2020-09-07 — End: 2020-09-08
  Administered 2020-09-07: 0.3 mg via INTRAVENOUS
  Administered 2020-09-07: 0 mg via INTRAVENOUS
  Administered 2020-09-07: 2.3 mg via INTRAVENOUS
  Administered 2020-09-07: 1.5 mg via INTRAVENOUS
  Administered 2020-09-07: 30 mg via INTRAVENOUS
  Administered 2020-09-07 – 2020-09-08 (×3): 0 mg via INTRAVENOUS

## 2020-09-07 MED ORDER — ONDANSETRON HCL 4 MG/2ML IJ SOLN
INTRAMUSCULAR | Status: DC | PRN
  Administered 2020-09-07: 4 mg via INTRAVENOUS

## 2020-09-07 MED ORDER — PROMETHAZINE HCL 25 MG/ML IJ SOLN: 6.2500 mg | INTRAMUSCULAR | Status: DC | PRN

## 2020-09-07 MED ORDER — SODIUM CHLORIDE 0.9% FLUSH: 9.0000 mL | INTRAVENOUS | Status: DC | PRN

## 2020-09-07 MED ORDER — IOHEXOL 350 MG/ML SOLN
75.0000 mL | Freq: Once | INTRAVENOUS | Status: AC | PRN
  Administered 2020-09-07: 75 mL via INTRAVENOUS

## 2020-09-07 MED ORDER — DIPHENHYDRAMINE HCL 50 MG/ML IJ SOLN: 12.5000 mg | Freq: Four times a day (QID) | INTRAMUSCULAR | Status: DC | PRN

## 2020-09-07 MED ORDER — ONDANSETRON HCL 4 MG/2ML IJ SOLN: 4.0000 mg | Freq: Four times a day (QID) | INTRAMUSCULAR | Status: DC | PRN

## 2020-09-07 MED ORDER — ACETAMINOPHEN 500 MG PO TABS
1000.0000 mg | ORAL_TABLET | Freq: Four times a day (QID) | ORAL | Status: DC
  Administered 2020-09-07 – 2020-09-10 (×11): 1000 mg via ORAL
  Filled 2020-09-07 (×12): qty 2

## 2020-09-07 MED ORDER — OXYCODONE HCL 5 MG PO TABS: 5.0000 mg | ORAL_TABLET | Freq: Once | ORAL | Status: DC | PRN

## 2020-09-07 MED ORDER — DEXTROSE-NACL 5-0.9 % IV SOLN: INTRAVENOUS | Status: DC

## 2020-09-07 MED ORDER — MEPERIDINE HCL 25 MG/ML IJ SOLN: 6.2500 mg | INTRAMUSCULAR | Status: DC | PRN

## 2020-09-07 MED ORDER — HYDROMORPHONE HCL 1 MG/ML IJ SOLN
INTRAMUSCULAR | Status: AC
  Filled 2020-09-07: qty 1

## 2020-09-07 MED ORDER — ONDANSETRON HCL 4 MG/2ML IJ SOLN
4.0000 mg | Freq: Four times a day (QID) | INTRAMUSCULAR | Status: DC | PRN
  Administered 2020-09-07 – 2020-09-09 (×3): 4 mg via INTRAVENOUS
  Filled 2020-09-07 (×3): qty 2

## 2020-09-07 MED ORDER — SUGAMMADEX SODIUM 200 MG/2ML IV SOLN
INTRAVENOUS | Status: DC | PRN
  Administered 2020-09-07: 200 mg via INTRAVENOUS

## 2020-09-07 NOTE — Transfer of Care (Signed)
Immediate Anesthesia Transfer of Care Note  Patient: Francene Castle  Procedure(s) Performed: EXPLORATORY LAPAROTOMY with Splenectomy (Abdomen) LACERATION REPAIR (Left: Leg Lower)  Patient Location: PACU  Anesthesia Type:General  Level of Consciousness: drowsy and responds to stimulation  Airway & Oxygen Therapy: Patient Spontanous Breathing and Patient connected to face mask oxygen  Post-op Assessment: Report given to RN, Post -op Vital signs reviewed and stable and Patient moving all extremities X 4  Post vital signs: Reviewed and stable  Last Vitals:  Vitals Value Taken Time  BP    Temp    Pulse 103 09/07/20 0038  Resp 23 09/07/20 0038  SpO2 100 % 09/07/20 0038  Vitals shown include unvalidated device data.  Last Pain:  Vitals:   09/06/20 2136  TempSrc: Temporal         Complications: No notable events documented.

## 2020-09-07 NOTE — Plan of Care (Signed)
  Problem: Education: Goal: Knowledge of General Education information will improve Description: Including pain rating scale, medication(s)/side effects and non-pharmacologic comfort measures Outcome: Progressing   Problem: Health Behavior/Discharge Planning: Goal: Ability to manage health-related needs will improve Outcome: Progressing   Problem: Clinical Measurements: Goal: Ability to maintain clinical measurements within normal limits will improve Outcome: Progressing Goal: Will remain free from infection Outcome: Progressing Goal: Diagnostic test results will improve Outcome: Progressing Goal: Respiratory complications will improve Outcome: Progressing Goal: Cardiovascular complication will be avoided Outcome: Progressing   Problem: Nutrition: Goal: Adequate nutrition will be maintained Outcome: Progressing   Problem: Coping: Goal: Level of anxiety will decrease Outcome: Progressing   Problem: Elimination: Goal: Will not experience complications related to bowel motility Outcome: Progressing Goal: Will not experience complications related to urinary retention Outcome: Progressing   Problem: Skin Integrity: Goal: Risk for impaired skin integrity will decrease Outcome: Progressing   
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
## 2020-09-07 NOTE — Progress Notes (Signed)
Patients apple watch, ID and wallet given to primary RN on 4N

## 2020-09-07 NOTE — ED Notes (Signed)
Dr. Derrell Lolling notified that on the way up to OR, pt kept repeating the same question, regarding to what was going on. Provider stated he was done in OR and pt will be going to ICU
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
## 2020-09-07 NOTE — Progress Notes (Signed)
Warren Reyes is a 25 y.o. male patient admitted. Awake, alert - oriented  X 4 - no acute distress noted.  VSS - Blood pressure 124/78, pulse 83, temperature 98.6 F (37 C), temperature source Oral, resp. rate 16, height 5\' 10"  (1.778 m), weight 72.6 kg, SpO2 99 %.    IV in place, occlusive dsg intact without redness.  Orientation to room, and floor completed.  Admission INP armband ID verified with patient/family, and in place.   SR up x 2, fall assessment complete, with patient and family able to verbalize understanding of risk associated with falls, and verbalized understanding to call nsg before up out of bed.  Call light within reach, patient able to voice, and demonstrate understanding. No evidence of skin break down noted on exam.      Will cont to eval and treat per MD orders.  , RN 09/07/2020 5:59 PM
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
## 2020-09-08 ENCOUNTER — Inpatient Hospital Stay (HOSPITAL_COMMUNITY): Payer: No Typology Code available for payment source

## 2020-09-08 ENCOUNTER — Inpatient Hospital Stay (HOSPITAL_COMMUNITY): Payer: No Typology Code available for payment source | Admitting: Certified Registered"

## 2020-09-08 ENCOUNTER — Encounter (HOSPITAL_COMMUNITY): Admission: EM | Disposition: A | Discharge status: Home / Self Care | Payer: Self-pay | Source: Home / Self Care

## 2020-09-08 HISTORY — PX: ORIF WRIST FRACTURE: SHX2133

## 2020-09-08 LAB — CBC
HCT: 24.7 % — ABNORMAL LOW (ref 39.0–52.0)
Hemoglobin: 8.3 g/dL — ABNORMAL LOW (ref 13.0–17.0)
MCH: 30.2 pg (ref 26.0–34.0)
MCHC: 33.6 g/dL (ref 30.0–36.0)
MCV: 89.8 fL (ref 80.0–100.0)
Platelets: 155 10*3/uL (ref 150–400)
RBC: 2.75 MIL/uL — ABNORMAL LOW (ref 4.22–5.81)
RDW: 12.5 % (ref 11.5–15.5)
WBC: 13.1 10*3/uL — ABNORMAL HIGH (ref 4.0–10.5)
nRBC: 0.5 % — ABNORMAL HIGH (ref 0.0–0.2)

## 2020-09-08 LAB — BASIC METABOLIC PANEL
Anion gap: 11 (ref 5–15)
BUN: 12 mg/dL (ref 6–20)
CO2: 24 mmol/L (ref 22–32)
Calcium: 8.4 mg/dL — ABNORMAL LOW (ref 8.9–10.3)
Chloride: 105 mmol/L (ref 98–111)
Creatinine, Ser: 0.96 mg/dL (ref 0.61–1.24)
GFR, Estimated: >60 mL/min (ref >60)
Glucose, Bld: 82 mg/dL (ref 70–99)
Potassium: 3.6 mmol/L (ref 3.5–5.1)
Sodium: 140 mmol/L (ref 135–145)

## 2020-09-08 LAB — SURGICAL PATHOLOGY

## 2020-09-08 SURGERY — OPEN REDUCTION INTERNAL FIXATION (ORIF) WRIST FRACTURE
Anesthesia: General | Site: Wrist | Laterality: Left

## 2020-09-08 MED ORDER — MIDAZOLAM HCL 2 MG/2ML IJ SOLN
INTRAMUSCULAR | Status: AC
  Filled 2020-09-08: qty 2

## 2020-09-08 MED ORDER — LACTATED RINGERS IV SOLN: INTRAVENOUS | Status: DC | PRN

## 2020-09-08 MED ORDER — ACETAMINOPHEN 325 MG PO TABS: 325.0000 mg | ORAL_TABLET | ORAL | Status: DC | PRN

## 2020-09-08 MED ORDER — ONDANSETRON HCL 4 MG/2ML IJ SOLN
INTRAMUSCULAR | Status: DC | PRN
  Administered 2020-09-08: 4 mg via INTRAVENOUS

## 2020-09-08 MED ORDER — LIDOCAINE 2% (20 MG/ML) 5 ML SYRINGE
INTRAMUSCULAR | Status: DC | PRN
  Administered 2020-09-08: 100 mg via INTRAVENOUS

## 2020-09-08 MED ORDER — FENTANYL CITRATE (PF) 100 MCG/2ML IJ SOLN: 25.0000 ug | INTRAMUSCULAR | Status: DC | PRN

## 2020-09-08 MED ORDER — MIDAZOLAM HCL 2 MG/2ML IJ SOLN
INTRAMUSCULAR | Status: DC | PRN
  Administered 2020-09-08: 2 mg via INTRAVENOUS

## 2020-09-08 MED ORDER — CEFAZOLIN SODIUM-DEXTROSE 2-4 GM/100ML-% IV SOLN
2.0000 g | Freq: Three times a day (TID) | INTRAVENOUS | Status: AC
  Administered 2020-09-08 – 2020-09-09 (×3): 2 g via INTRAVENOUS
  Filled 2020-09-08 (×3): qty 100

## 2020-09-08 MED ORDER — ASPIRIN 81 MG PO CHEW
81.0000 mg | CHEWABLE_TABLET | Freq: Every day | ORAL | Status: DC
  Administered 2020-09-10: 81 mg via ORAL
  Filled 2020-09-08: qty 1

## 2020-09-08 MED ORDER — DEXMEDETOMIDINE (PRECEDEX) IN NS 20 MCG/5ML (4 MCG/ML) IV SYRINGE
PREFILLED_SYRINGE | INTRAVENOUS | Status: DC | PRN
  Administered 2020-09-08: 20 ug via INTRAVENOUS

## 2020-09-08 MED ORDER — FENTANYL CITRATE (PF) 100 MCG/2ML IJ SOLN
INTRAMUSCULAR | Status: AC
  Filled 2020-09-08: qty 2

## 2020-09-08 MED ORDER — OXYCODONE HCL 5 MG/5ML PO SOLN: 5.0000 mg | Freq: Once | ORAL | Status: DC | PRN

## 2020-09-08 MED ORDER — CEFAZOLIN SODIUM-DEXTROSE 2-4 GM/100ML-% IV SOLN: 2.0000 g | INTRAVENOUS | Status: DC

## 2020-09-08 MED ORDER — HYDROMORPHONE HCL 1 MG/ML IJ SOLN
INTRAMUSCULAR | Status: AC
  Filled 2020-09-08: qty 0.5

## 2020-09-08 MED ORDER — HYDROMORPHONE HCL 1 MG/ML IJ SOLN
INTRAMUSCULAR | Status: DC | PRN
  Administered 2020-09-08: .5 mg via INTRAVENOUS

## 2020-09-08 MED ORDER — PHENYLEPHRINE HCL-NACL 20-0.9 MG/250ML-% IV SOLN
INTRAVENOUS | Status: DC | PRN
  Administered 2020-09-08: 50 ug/min via INTRAVENOUS

## 2020-09-08 MED ORDER — MEPERIDINE HCL 25 MG/ML IJ SOLN: 6.2500 mg | INTRAMUSCULAR | Status: DC | PRN

## 2020-09-08 MED ORDER — SUGAMMADEX SODIUM 200 MG/2ML IV SOLN
INTRAVENOUS | Status: DC | PRN
  Administered 2020-09-08: 200 mg via INTRAVENOUS

## 2020-09-08 MED ORDER — PROPOFOL 10 MG/ML IV BOLUS
INTRAVENOUS | Status: DC | PRN
  Administered 2020-09-08: 150 mg via INTRAVENOUS

## 2020-09-08 MED ORDER — FENTANYL CITRATE (PF) 250 MCG/5ML IJ SOLN
INTRAMUSCULAR | Status: DC | PRN
  Administered 2020-09-08: 50 ug via INTRAVENOUS
  Administered 2020-09-08: 100 ug via INTRAVENOUS
  Administered 2020-09-08 (×2): 50 ug via INTRAVENOUS

## 2020-09-08 MED ORDER — PROPOFOL 10 MG/ML IV BOLUS
INTRAVENOUS | Status: AC
  Filled 2020-09-08: qty 20

## 2020-09-08 MED ORDER — FENTANYL CITRATE (PF) 250 MCG/5ML IJ SOLN
INTRAMUSCULAR | Status: AC
  Filled 2020-09-08: qty 5

## 2020-09-08 MED ORDER — 0.9 % SODIUM CHLORIDE (POUR BTL) OPTIME
TOPICAL | Status: DC | PRN
  Administered 2020-09-08: 1000 mL

## 2020-09-08 MED ORDER — SUCCINYLCHOLINE CHLORIDE 200 MG/10ML IV SOSY
PREFILLED_SYRINGE | INTRAVENOUS | Status: DC | PRN
  Administered 2020-09-08: 160 mg via INTRAVENOUS
  Administered 2020-09-08: 100 mg via INTRAVENOUS

## 2020-09-08 MED ORDER — PHENYLEPHRINE 40 MCG/ML (10ML) SYRINGE FOR IV PUSH (FOR BLOOD PRESSURE SUPPORT)
PREFILLED_SYRINGE | INTRAVENOUS | Status: DC | PRN
  Administered 2020-09-08 (×2): 200 ug via INTRAVENOUS

## 2020-09-08 MED ORDER — CHLORHEXIDINE GLUCONATE 4 % EX LIQD: 60.0000 mL | Freq: Once | CUTANEOUS | Status: DC

## 2020-09-08 MED ORDER — DEXAMETHASONE SODIUM PHOSPHATE 10 MG/ML IJ SOLN
INTRAMUSCULAR | Status: DC | PRN
  Administered 2020-09-08: 10 mg via INTRAVENOUS

## 2020-09-08 MED ORDER — ACETAMINOPHEN 160 MG/5ML PO SOLN: 325.0000 mg | ORAL | Status: DC | PRN

## 2020-09-08 MED ORDER — ONDANSETRON HCL 4 MG/2ML IJ SOLN: 4.0000 mg | Freq: Once | INTRAMUSCULAR | Status: DC | PRN

## 2020-09-08 MED ORDER — POVIDONE-IODINE 10 % EX SWAB: 2.0000 "application " | Freq: Once | CUTANEOUS | Status: DC

## 2020-09-08 MED ORDER — DOCUSATE SODIUM 100 MG PO CAPS
100.0000 mg | ORAL_CAPSULE | Freq: Two times a day (BID) | ORAL | Status: DC
  Administered 2020-09-08 – 2020-09-10 (×5): 100 mg via ORAL
  Filled 2020-09-08 (×5): qty 1

## 2020-09-08 MED ORDER — OXYCODONE HCL 5 MG PO TABS: 5.0000 mg | ORAL_TABLET | Freq: Once | ORAL | Status: DC | PRN

## 2020-09-08 MED ORDER — CEFAZOLIN SODIUM-DEXTROSE 2-3 GM-%(50ML) IV SOLR
INTRAVENOUS | Status: DC | PRN
  Administered 2020-09-08: 2 g via INTRAVENOUS

## 2020-09-08 SURGICAL SUPPLY — 57 items
BAG COUNTER SPONGE SURGICOUNT (BAG) ×2 IMPLANT
BAG SPNG CNTER NS LX DISP (BAG) ×1
BIT DRILL 2.2 SS TIBIAL (BIT) ×1 IMPLANT
BLADE CLIPPER SURG (BLADE) ×2 IMPLANT
BNDG CMPR 9X4 STRL LF SNTH (GAUZE/BANDAGES/DRESSINGS) ×1
BNDG ELASTIC 3X5.8 VLCR STR LF (GAUZE/BANDAGES/DRESSINGS) ×1 IMPLANT
BNDG ELASTIC 4X5.8 VLCR STR LF (GAUZE/BANDAGES/DRESSINGS) ×2 IMPLANT
BNDG ESMARK 4X9 LF (GAUZE/BANDAGES/DRESSINGS) ×2 IMPLANT
BRUSH SCRUB EZ PLAIN DRY (MISCELLANEOUS) ×4 IMPLANT
CORD BIPOLAR FORCEPS 12FT (ELECTRODE) ×1 IMPLANT
COVER SURGICAL LIGHT HANDLE (MISCELLANEOUS) ×2 IMPLANT
DRAPE C-ARM 42X72 X-RAY (DRAPES) ×2 IMPLANT
DRSG EMULSION OIL 3X3 NADH (GAUZE/BANDAGES/DRESSINGS) ×2 IMPLANT
DRSG MEPITEL 4X7.2 (GAUZE/BANDAGES/DRESSINGS) ×1 IMPLANT
ELECT REM PT RETURN 9FT ADLT (ELECTROSURGICAL) ×2
ELECTRODE REM PT RTRN 9FT ADLT (ELECTROSURGICAL) ×1 IMPLANT
GAUZE SPONGE 4X4 12PLY STRL (GAUZE/BANDAGES/DRESSINGS) ×2 IMPLANT
GLOVE SRG 8 PF TXTR STRL LF DI (GLOVE) ×1 IMPLANT
GLOVE SURG ENC MOIS LTX SZ7.5 (GLOVE) ×2 IMPLANT
GLOVE SURG ENC MOIS LTX SZ8 (GLOVE) ×2 IMPLANT
GLOVE SURG UNDER POLY LF SZ7.5 (GLOVE) ×2 IMPLANT
GLOVE SURG UNDER POLY LF SZ8 (GLOVE) ×2
GOWN STRL REUS W/ TWL LRG LVL3 (GOWN DISPOSABLE) ×2 IMPLANT
GOWN STRL REUS W/ TWL XL LVL3 (GOWN DISPOSABLE) ×1 IMPLANT
GOWN STRL REUS W/TWL LRG LVL3 (GOWN DISPOSABLE) ×4
GOWN STRL REUS W/TWL XL LVL3 (GOWN DISPOSABLE) ×2
K-WIRE 1.6 (WIRE) ×6
K-WIRE FX5X1.6XNS BN SS (WIRE) ×3
KIT BASIN OR (CUSTOM PROCEDURE TRAY) ×2 IMPLANT
KIT TURNOVER KIT B (KITS) ×2 IMPLANT
KWIRE FX5X1.6XNS BN SS (WIRE) IMPLANT
NS IRRIG 1000ML POUR BTL (IV SOLUTION) ×2 IMPLANT
PACK ORTHO EXTREMITY (CUSTOM PROCEDURE TRAY) ×2 IMPLANT
PAD ARMBOARD 7.5X6 YLW CONV (MISCELLANEOUS) ×4 IMPLANT
PAD CAST 3X4 CTTN HI CHSV (CAST SUPPLIES) ×1 IMPLANT
PAD CAST 4YDX4 CTTN HI CHSV (CAST SUPPLIES) IMPLANT
PADDING CAST COTTON 3X4 STRL (CAST SUPPLIES) ×2
PADDING CAST COTTON 4X4 STRL (CAST SUPPLIES) ×2
PEG LOCKING SMOOTH 2.2X20 (Screw) ×3 IMPLANT
PEG LOCKING SMOOTH 2.2X22 (Screw) ×2 IMPLANT
PEG LOCKING SMOOTH 2.2X24 (Peg) ×1 IMPLANT
PIN CAPS ORTHO GREEN .062 (PIN) ×1 IMPLANT
PLATE NARROW DVR LEFT (Plate) ×1 IMPLANT
SCREW LOCK 14X2.7X 3 LD TPR (Screw) IMPLANT
SCREW LOCK 16X2.7X 3 LD TPR (Screw) IMPLANT
SCREW LOCKING 2.7X14 (Screw) ×2 IMPLANT
SCREW LOCKING 2.7X16 (Screw) ×6 IMPLANT
SPLINT PLASTER EXTRA FAST 3X15 (CAST SUPPLIES) ×1
SPLINT PLASTER GYPS XFAST 3X15 (CAST SUPPLIES) IMPLANT
SUT ETHILON 3 0 PS 1 (SUTURE) ×4 IMPLANT
SUT VIC AB 0 CT3 27 (SUTURE) ×2 IMPLANT
SUT VIC AB 2-0 CT1 27 (SUTURE) ×4
SUT VIC AB 2-0 CT1 TAPERPNT 27 (SUTURE) IMPLANT
TOWEL GREEN STERILE (TOWEL DISPOSABLE) ×4 IMPLANT
TOWEL GREEN STERILE FF (TOWEL DISPOSABLE) ×2 IMPLANT
TUBE CONNECTING 12X1/4 (SUCTIONS) ×2 IMPLANT
UNDERPAD 30X36 HEAVY ABSORB (UNDERPADS AND DIAPERS) ×2 IMPLANT
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
## 2020-09-08 NOTE — Progress Notes (Signed)
OT Cancellation Note  Patient Details Name: Warren Reyes MRN: [id number] DOB: 19-May-1995   Cancelled Treatment:    Reason Eval/Treat Not Completed: Patient at procedure or test/ unavailable (Pt in OR for L wrist fixation. Will plan to complete OT evaluation tomorrow as schedule allows.)  Lelon Mast A Emmi Wertheim 09/08/2020, 12:47 PM

## 2020-09-08 NOTE — Anesthesia Procedure Notes (Signed)
Procedure Name: Intubation Date/Time: 09/08/2020 12:53 PM Performed by: Rosiland Oz, CRNA Pre-anesthesia Checklist: Patient identified, Emergency Drugs available, Suction available, Patient being monitored and Timeout performed Patient Re-evaluated:Patient Re-evaluated prior to induction Oxygen Delivery Method: Circle system utilized Preoxygenation: Pre-oxygenation with 100% oxygen Induction Type: IV induction and Rapid sequence Laryngoscope Size: Miller and 3 Grade View: Grade I Tube type: Oral Tube size: 7.5 mm Number of attempts: 1 Airway Equipment and Method: Stylet Placement Confirmation: ETT inserted through vocal cords under direct vision, positive ETCO2 and breath sounds checked- equal and bilateral Secured at: 22 cm Tube secured with: Tape Dental Injury: Teeth and Oropharynx as per pre-operative assessment
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
## 2020-09-08 NOTE — Progress Notes (Signed)
Transition of Care [facility]) - CAGE-AID Screening   Patient Details  Name: Warren Reyes MRN: [id number] Date of Birth: 1996/01/31     Clinical Narrative:  Pt denies drug or alcohol use/abuse  CAGE-AID Screening:    Have You Ever Felt You Ought to Cut Down on Your Drinking or Drug Use?: No Have People Annoyed You By [employer] Your Drinking Or Drug Use?: No Have You Felt Bad Or Guilty About Your Drinking Or Drug Use?: No Have You Ever Had a Drink or Used Drugs First Thing In The Morning to Steady Your Nerves or to Get Rid of a Hangover?: No CAGE-AID Score: 0
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
## 2020-09-09 LAB — BASIC METABOLIC PANEL
Anion gap: 7 (ref 5–15)
BUN: 9 mg/dL (ref 6–20)
CO2: 24 mmol/L (ref 22–32)
Calcium: 7.4 mg/dL — ABNORMAL LOW (ref 8.9–10.3)
Chloride: 107 mmol/L (ref 98–111)
Creatinine, Ser: 0.73 mg/dL (ref 0.61–1.24)
GFR, Estimated: >60 mL/min (ref >60)
Glucose, Bld: 117 mg/dL — ABNORMAL HIGH (ref 70–99)
Potassium: 3.4 mmol/L — ABNORMAL LOW (ref 3.5–5.1)
Sodium: 138 mmol/L (ref 135–145)

## 2020-09-09 LAB — CBC
HCT: 20.3 % — ABNORMAL LOW (ref 39.0–52.0)
HCT: 20.9 % — ABNORMAL LOW (ref 39.0–52.0)
Hemoglobin: 7 g/dL — ABNORMAL LOW (ref 13.0–17.0)
Hemoglobin: 7.4 g/dL — ABNORMAL LOW (ref 13.0–17.0)
MCH: 30.3 pg (ref 26.0–34.0)
MCH: 31.4 pg (ref 26.0–34.0)
MCHC: 34.5 g/dL (ref 30.0–36.0)
MCHC: 35.4 g/dL (ref 30.0–36.0)
MCV: 87.9 fL (ref 80.0–100.0)
MCV: 88.6 fL (ref 80.0–100.0)
Platelets: 203 10*3/uL (ref 150–400)
Platelets: 259 10*3/uL (ref 150–400)
RBC: 2.31 MIL/uL — ABNORMAL LOW (ref 4.22–5.81)
RBC: 2.36 MIL/uL — ABNORMAL LOW (ref 4.22–5.81)
RDW: 12.2 % (ref 11.5–15.5)
RDW: 12.2 % (ref 11.5–15.5)
WBC: 10.1 10*3/uL (ref 4.0–10.5)
WBC: 13.9 10*3/uL — ABNORMAL HIGH (ref 4.0–10.5)
nRBC: 0.5 % — ABNORMAL HIGH (ref 0.0–0.2)
nRBC: 0.6 % — ABNORMAL HIGH (ref 0.0–0.2)

## 2020-09-09 MED ORDER — ENOXAPARIN SODIUM 30 MG/0.3ML IJ SOSY
30.0000 mg | PREFILLED_SYRINGE | Freq: Two times a day (BID) | INTRAMUSCULAR | Status: DC
  Administered 2020-09-09 – 2020-09-10 (×2): 30 mg via SUBCUTANEOUS
  Filled 2020-09-09 (×2): qty 0.3

## 2020-09-09 MED ORDER — BACITRACIN ZINC 500 UNIT/GM EX OINT
TOPICAL_OINTMENT | Freq: Two times a day (BID) | CUTANEOUS | Status: DC
  Administered 2020-09-10: 1 via TOPICAL
  Filled 2020-09-09 (×2): qty 28.35

## 2020-09-09 MED ORDER — POLYETHYLENE GLYCOL 3350 17 G PO PACK: 17.0000 g | PACK | Freq: Every day | ORAL | Status: DC | PRN

## 2020-09-09 MED ORDER — LIDOCAINE-PRILOCAINE 2.5-2.5 % EX CREA
TOPICAL_CREAM | Freq: Four times a day (QID) | CUTANEOUS | Status: DC | PRN
  Filled 2020-09-09: qty 5

## 2020-09-09 MED ORDER — POTASSIUM CHLORIDE CRYS ER 20 MEQ PO TBCR
40.0000 meq | EXTENDED_RELEASE_TABLET | Freq: Two times a day (BID) | ORAL | Status: AC
  Administered 2020-09-09 (×2): 40 meq via ORAL
  Filled 2020-09-09 (×2): qty 2
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
## 2020-09-09 NOTE — Transfer of Care (Signed)
Immediate Anesthesia Transfer of Care Note  Patient: Francene Castle  Procedure(s) Performed: OPEN REDUCTION INTERNAL FIXATION (ORIF) WRIST FRACTURE (Left: Wrist)  Patient Location: PACU  Anesthesia Type:General  Level of Consciousness: awake and patient cooperative  Airway & Oxygen Therapy: Patient Spontanous Breathing  Post-op Assessment: Report given to RN and Post -op Vital signs reviewed and stable  Post vital signs: Reviewed and stable  Last Vitals:  Vitals Value Taken Time  BP 173/70 09/09/20 0803  Temp 37.3 C 09/09/20 0803  Pulse 79 09/09/20 0803  Resp 18 09/09/20 0803  SpO2 99 % 09/09/20 0803    Last Pain:  Vitals:   09/09/20 1100  TempSrc:   PainSc: 4          Complications: No notable events documented.

## 2020-09-09 NOTE — Anesthesia Postprocedure Evaluation (Signed)
Anesthesia Post Note  Patient: Francene Castle  Procedure(s) Performed: OPEN REDUCTION INTERNAL FIXATION (ORIF) WRIST FRACTURE (Left: Wrist)     Patient location during evaluation: PACU Anesthesia Type: General Level of consciousness: awake and alert Pain management: pain level controlled Vital Signs Assessment: post-procedure vital signs reviewed and stable Respiratory status: spontaneous breathing, nonlabored ventilation, respiratory function stable and patient connected to nasal cannula oxygen Cardiovascular status: blood pressure returned to baseline and stable Postop Assessment: no apparent nausea or vomiting Anesthetic complications: no   No notable events documented.  Last Vitals:  Vitals:   09/09/20 0423 09/09/20 0803  BP: (!) 175/72 (!) 173/70  Pulse: 89 79  Resp: 19 18  Temp: 37.1 C 37.3 C  SpO2: 100% 99%    Last Pain:  Vitals:   09/09/20 1100  TempSrc:   PainSc: 4                  Jaline Pincock

## 2020-09-09 NOTE — Progress Notes (Signed)
Patient ID: Warren Reyes, male   DOB: 07/10/1995, 25 y.o.   MRN: [id number]  I personally reviewed his maxillofacial CT demonstrating non-displaced left orbitozygomatic complex fracture and skull base fracture through the sphenoid sinus and including a subtle right longitudinal temporal bone fracture.  Injuries remain non-operative.  Continue Ciprodex drops in right ear.  Plan follow-up as outpatient in two weeks.
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
## 2020-09-10 ENCOUNTER — Encounter (HOSPITAL_COMMUNITY): Payer: Self-pay | Admitting: Orthopedic Surgery

## 2020-09-10 ENCOUNTER — Other Ambulatory Visit (HOSPITAL_COMMUNITY): Payer: Self-pay

## 2020-09-10 LAB — TYPE AND SCREEN
ABO/RH(D): A POS
Antibody Screen: NEGATIVE
Unit division: 0
Unit division: 0
Unit division: 0
Unit division: 0
Unit division: 0
Unit division: 0
Unit division: 0
Unit division: 0
Unit division: 0
Unit division: 0

## 2020-09-10 LAB — BPAM RBC
Blood Product Expiration Date: 202208242359
Blood Product Expiration Date: 202208242359
Blood Product Expiration Date: 202208272359
Blood Product Expiration Date: 202208272359
Blood Product Expiration Date: 202208282359
Blood Product Expiration Date: 202208282359
Blood Product Expiration Date: 202208312359
Blood Product Expiration Date: 202208312359
Blood Product Expiration Date: 202208312359
Blood Product Expiration Date: 202208312359
ISSUE DATE / TIME: 202207312211
ISSUE DATE / TIME: 202207312211
ISSUE DATE / TIME: 202208010915
ISSUE DATE / TIME: 202208011023
ISSUE DATE / TIME: 202208011142
ISSUE DATE / TIME: 202208022105
Unit Type and Rh: 5100
Unit Type and Rh: 5100
Unit Type and Rh: 5100
Unit Type and Rh: 5100
Unit Type and Rh: 5100
Unit Type and Rh: 5100
Unit Type and Rh: 6200
Unit Type and Rh: 6200
Unit Type and Rh: 6200
Unit Type and Rh: 6200

## 2020-09-10 LAB — BASIC METABOLIC PANEL
Anion gap: 4 — ABNORMAL LOW (ref 5–15)
BUN: 8 mg/dL (ref 6–20)
CO2: 25 mmol/L (ref 22–32)
Calcium: 8.1 mg/dL — ABNORMAL LOW (ref 8.9–10.3)
Chloride: 107 mmol/L (ref 98–111)
Creatinine, Ser: 0.89 mg/dL (ref 0.61–1.24)
GFR, Estimated: >60 mL/min (ref >60)
Glucose, Bld: 103 mg/dL — ABNORMAL HIGH (ref 70–99)
Potassium: 4 mmol/L (ref 3.5–5.1)
Sodium: 136 mmol/L (ref 135–145)

## 2020-09-10 LAB — CBC
HCT: 21.3 % — ABNORMAL LOW (ref 39.0–52.0)
Hemoglobin: 7.4 g/dL — ABNORMAL LOW (ref 13.0–17.0)
MCH: 30.7 pg (ref 26.0–34.0)
MCHC: 34.7 g/dL (ref 30.0–36.0)
MCV: 88.4 fL (ref 80.0–100.0)
Platelets: 323 10*3/uL (ref 150–400)
RBC: 2.41 MIL/uL — ABNORMAL LOW (ref 4.22–5.81)
RDW: 12 % (ref 11.5–15.5)
WBC: 14 10*3/uL — ABNORMAL HIGH (ref 4.0–10.5)
nRBC: 1 % — ABNORMAL HIGH (ref 0.0–0.2)

## 2020-09-10 LAB — MAGNESIUM: Magnesium: 1.9 mg/dL (ref 1.7–2.4)

## 2020-09-10 MED ORDER — METHOCARBAMOL 500 MG PO TABS
1000.0000 mg | ORAL_TABLET | Freq: Three times a day (TID) | ORAL | 0 refills | Status: AC | PRN
  Filled 2020-09-10: qty 60, 10d supply, fill #0

## 2020-09-10 MED ORDER — POLYETHYLENE GLYCOL 3350 17 G PO PACK
17.0000 g | PACK | Freq: Every day | ORAL | Status: DC
  Administered 2020-09-10: 17 g via ORAL
  Filled 2020-09-10 (×2): qty 1

## 2020-09-10 MED ORDER — DOCUSATE SODIUM 100 MG PO CAPS: 100.0000 mg | ORAL_CAPSULE | Freq: Two times a day (BID) | ORAL | 0 refills | Status: DC | PRN

## 2020-09-10 MED ORDER — ASPIRIN 81 MG PO CHEW
81.0000 mg | CHEWABLE_TABLET | Freq: Every day | ORAL | 0 refills | Status: DC
  Filled 2020-09-10: qty 42, 42d supply, fill #0

## 2020-09-10 MED ORDER — MENINGOCOCCAL A C Y&W-135 OLIG IM SOLR
0.5000 mL | INTRAMUSCULAR | Status: AC | PRN
  Administered 2020-09-10: 0.5 mL via INTRAMUSCULAR
  Filled 2020-09-10: qty 0.5

## 2020-09-10 MED ORDER — OXYCODONE HCL 5 MG PO TABS
5.0000 mg | ORAL_TABLET | Freq: Four times a day (QID) | ORAL | 0 refills | Status: AC | PRN
  Filled 2020-09-10: qty 15, 4d supply, fill #0

## 2020-09-10 MED ORDER — PNEUMOCOCCAL 13-VAL CONJ VACC IM SUSP
0.5000 mL | INTRAMUSCULAR | Status: AC | PRN
  Administered 2020-09-10: 0.5 mL via INTRAMUSCULAR
  Filled 2020-09-10: qty 0.5

## 2020-09-10 MED ORDER — CIPROFLOXACIN-DEXAMETHASONE 0.3-0.1 % OT SUSP
4.0000 [drp] | Freq: Two times a day (BID) | OTIC | 0 refills | Status: DC
  Filled 2020-09-10: qty 7.5, 19d supply, fill #0

## 2020-09-10 MED ORDER — POLYETHYLENE GLYCOL 3350 17 G PO PACK: 17.0000 g | PACK | Freq: Every day | ORAL | 0 refills | Status: DC | PRN

## 2020-09-10 MED ORDER — HAEMOPHILUS B POLYSAC CONJ VAC IM SOLR
0.5000 mL | INTRAMUSCULAR | Status: AC | PRN
  Administered 2020-09-10: 0.5 mL via INTRAMUSCULAR
  Filled 2020-09-10 (×2): qty 0.5

## 2020-09-10 MED ORDER — BACITRACIN ZINC 500 UNIT/GM EX OINT
TOPICAL_OINTMENT | Freq: Two times a day (BID) | CUTANEOUS | 0 refills | Status: AC
  Filled 2020-09-10: qty 28.4, 7d supply, fill #0

## 2020-09-10 MED ORDER — ACETAMINOPHEN 500 MG PO TABS: 1000.0000 mg | ORAL_TABLET | Freq: Three times a day (TID) | ORAL | 0 refills | Status: DC | PRN

## 2020-09-10 MED ORDER — ONDANSETRON 4 MG PO TBDP
4.0000 mg | ORAL_TABLET | Freq: Four times a day (QID) | ORAL | 0 refills | Status: AC | PRN
  Filled 2020-09-10: qty 20, 5d supply, fill #0

## 2020-09-10 NOTE — Progress Notes (Signed)
TOC meds given to pt.

## 2020-09-10 NOTE — Progress Notes (Signed)
Pt ready for DC to home with mom. AVs copy given and explained. 'vaccines given.
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
## 2020-09-10 NOTE — Progress Notes (Signed)
Asked Pharmacist if OK to administer the 3 vaccines with pt being positive for covid, he stated it was [facility].

## 2020-09-10 NOTE — Discharge Instructions (Addendum)
Please remain non-weight bearing to the left wrist. Please follow up with your Orthopedist Splint instructions attached  [facility]      [facility] [phone]  OPEN ABDOMINAL SURGERY: POST OP INSTRUCTIONS  Always review your discharge instruction sheet given to you by the facility where your surgery was performed.  IF YOU HAVE DISABILITY OR FAMILY LEAVE FORMS, YOU MUST BRING THEM TO THE OFFICE FOR PROCESSING.  PLEASE DO NOT GIVE THEM TO YOUR DOCTOR.  A prescription for pain medication may be given to you upon discharge.  Take your pain medication as prescribed, if needed.  If narcotic pain medicine is not needed, then you may take acetaminophen (Tylenol) or ibuprofen (Advil) as needed. Take your usually prescribed medications unless otherwise directed. If you need a refill on your pain medication, please contact your pharmacy. They will contact our office to request authorization.  Prescriptions will not be filled after 5pm or on week-ends. You should follow a light diet the first few days after arrival home, such as soup and crackers, pudding, etc.unless your doctor has advised otherwise. A high-fiber, low fat diet can be resumed as tolerated.   Be sure to include lots of fluids daily. Most patients will experience some swelling and bruising on the chest and neck area.  Ice packs will help.  Swelling and bruising can take several days to resolve Most patients will experience some swelling and bruising in the area of the incision. Ice pack will help. Swelling and bruising can take several days to resolve..  It is common to experience some constipation if taking pain medication after surgery.  Increasing fluid intake and taking a stool softener will usually help or prevent this problem from occurring.  A mild laxative (Milk of Magnesia or Miralax) should be taken according to package directions if there are no bowel movements after 48 hours.  You may have steri-strips (small skin  tapes) in place directly over the incision.  These strips should be left on the skin for 7-10 days.  If your surgeon used skin glue on the incision, you may shower in 24 hours.  The glue will flake off over the next 2-3 weeks.  Any sutures or staples will be removed at the office during your follow-up visit. You may find that a light gauze bandage over your incision may keep your staples from being rubbed or pulled. You may shower and replace the bandage daily. ACTIVITIES:  You may resume regular (light) daily activities beginning the next day--such as daily self-care, walking, climbing stairs--gradually increasing activities as tolerated.  You may have sexual intercourse when it is comfortable.  Refrain from any heavy lifting or straining until approved by your doctor. You may drive when you no longer are taking prescription pain medication, you can comfortably wear a seatbelt, and you can safely maneuver your car and apply brakes Return to Work: ___________________________________ You should see your doctor in the office for a follow-up appointment approximately two weeks after your surgery.  Make sure that you call for this appointment within a day or two after you arrive home to insure a convenient appointment time. OTHER INSTRUCTIONS:  _____________________________________________________________ _____________________________________________________________  WHEN TO CALL YOUR DOCTOR: Fever over 101.0 Inability to urinate Nausea and/or vomiting Extreme swelling or bruising Continued bleeding from incision. Increased pain, redness, or drainage from the incision. Difficulty swallowing or breathing Muscle cramping or spasms. Numbness or tingling in hands or feet or around lips.  The clinic staff is available to answer your   your questions during regular business hours.  Please don't hesitate to call and ask to speak to one of the nurses if you have concerns.  For further questions, please visit  [email or web address]

## 2020-09-11 ENCOUNTER — Encounter (HOSPITAL_COMMUNITY): Payer: Self-pay | Admitting: Emergency Medicine

## 2020-09-14 ENCOUNTER — Other Ambulatory Visit: Payer: Self-pay

## 2020-09-14 ENCOUNTER — Inpatient Hospital Stay (HOSPITAL_COMMUNITY)
Admission: EM | Admit: 2020-09-14 | Discharge: 2020-09-16 | DRG: 948 | Disposition: A | Discharge status: Home / Self Care | Payer: Self-pay | Attending: Surgery | Admitting: Surgery

## 2020-09-14 DIAGNOSIS — D72829 Elevated white blood cell count, unspecified: Secondary | ICD-10-CM | POA: Diagnosis present

## 2020-09-14 DIAGNOSIS — D62 Acute posthemorrhagic anemia: Secondary | ICD-10-CM | POA: Diagnosis present

## 2020-09-14 DIAGNOSIS — K920 Hematemesis: Secondary | ICD-10-CM | POA: Diagnosis present

## 2020-09-14 DIAGNOSIS — Z825 Family history of asthma and other chronic lower respiratory diseases: Secondary | ICD-10-CM

## 2020-09-14 DIAGNOSIS — R1013 Epigastric pain: Secondary | ICD-10-CM

## 2020-09-14 DIAGNOSIS — Z8249 Family history of ischemic heart disease and other diseases of the circulatory system: Secondary | ICD-10-CM

## 2020-09-14 DIAGNOSIS — Z7982 Long term (current) use of aspirin: Secondary | ICD-10-CM

## 2020-09-14 DIAGNOSIS — Z79899 Other long term (current) drug therapy: Secondary | ICD-10-CM

## 2020-09-14 DIAGNOSIS — Z809 Family history of malignant neoplasm, unspecified: Secondary | ICD-10-CM

## 2020-09-14 DIAGNOSIS — Z833 Family history of diabetes mellitus: Secondary | ICD-10-CM

## 2020-09-14 DIAGNOSIS — K5900 Constipation, unspecified: Secondary | ICD-10-CM | POA: Diagnosis present

## 2020-09-14 DIAGNOSIS — Z8673 Personal history of transient ischemic attack (TIA), and cerebral infarction without residual deficits: Secondary | ICD-10-CM

## 2020-09-14 DIAGNOSIS — Z9889 Other specified postprocedural states: Secondary | ICD-10-CM

## 2020-09-14 DIAGNOSIS — K668 Other specified disorders of peritoneum: Secondary | ICD-10-CM

## 2020-09-14 DIAGNOSIS — Z8616 Personal history of COVID-19: Secondary | ICD-10-CM

## 2020-09-14 DIAGNOSIS — G8918 Other acute postprocedural pain: Principal | ICD-10-CM | POA: Diagnosis present

## 2020-09-14 DIAGNOSIS — Z9081 Acquired absence of spleen: Secondary | ICD-10-CM

## 2020-09-14 MED ORDER — SODIUM CHLORIDE 0.9 % IV BOLUS
1000.0000 mL | Freq: Once | INTRAVENOUS | Status: AC
  Administered 2020-09-14: 1000 mL via INTRAVENOUS

## 2020-09-14 MED ORDER — PANTOPRAZOLE SODIUM 40 MG IV SOLR
40.0000 mg | Freq: Once | INTRAVENOUS | Status: AC
  Administered 2020-09-14: 40 mg via INTRAVENOUS
  Filled 2020-09-14: qty 40
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
## 2020-09-15 LAB — BASIC METABOLIC PANEL
Anion gap: 12 (ref 5–15)
Anion gap: 8 (ref 5–15)
BUN: 13 mg/dL (ref 6–20)
BUN: 11 mg/dL (ref 6–20)
CO2: 24 mmol/L (ref 22–32)
CO2: 24 mmol/L (ref 22–32)
Calcium: 9.4 mg/dL (ref 8.9–10.3)
Calcium: 8.8 mg/dL — ABNORMAL LOW (ref 8.9–10.3)
Chloride: 95 mmol/L — ABNORMAL LOW (ref 98–111)
Chloride: 99 mmol/L (ref 98–111)
Creatinine, Ser: 0.8 mg/dL (ref 0.61–1.24)
Creatinine, Ser: 0.92 mg/dL (ref 0.61–1.24)
GFR, Estimated: >60 mL/min (ref >60)
GFR, Estimated: >60 mL/min (ref >60)
Glucose, Bld: 87 mg/dL (ref 70–99)
Glucose, Bld: 103 mg/dL — ABNORMAL HIGH (ref 70–99)
Potassium: 4 mmol/L (ref 3.5–5.1)
Potassium: 4.1 mmol/L (ref 3.5–5.1)
Sodium: 131 mmol/L — ABNORMAL LOW (ref 135–145)
Sodium: 131 mmol/L — ABNORMAL LOW (ref 135–145)

## 2020-09-15 LAB — TYPE AND SCREEN
ABO/RH(D): A POS
Antibody Screen: NEGATIVE

## 2020-09-15 LAB — CBC WITH DIFFERENTIAL/PLATELET
Abs Immature Granulocytes: 0.15 10*3/uL — ABNORMAL HIGH (ref 0.00–0.07)
Basophils Absolute: 0 10*3/uL (ref 0.0–0.1)
Basophils Relative: 0 %
Eosinophils Absolute: 0.2 10*3/uL (ref 0.0–0.5)
Eosinophils Relative: 1 %
HCT: 29.7 % — ABNORMAL LOW (ref 39.0–52.0)
Hemoglobin: 10.8 g/dL — ABNORMAL LOW (ref 13.0–17.0)
Immature Granulocytes: 1 %
Lymphocytes Relative: 9 %
Lymphs Abs: 1.4 10*3/uL (ref 0.7–4.0)
MCH: 34.8 pg — ABNORMAL HIGH (ref 26.0–34.0)
MCHC: 36.4 g/dL — ABNORMAL HIGH (ref 30.0–36.0)
MCV: 95.8 fL (ref 80.0–100.0)
Monocytes Absolute: 1.6 10*3/uL — ABNORMAL HIGH (ref 0.1–1.0)
Monocytes Relative: 11 %
Neutro Abs: 11.4 10*3/uL — ABNORMAL HIGH (ref 1.7–7.7)
Neutrophils Relative %: 78 %
Platelets: 832 10*3/uL — ABNORMAL HIGH (ref 150–400)
RBC: 3.1 MIL/uL — ABNORMAL LOW (ref 4.22–5.81)
RDW: 14.7 % (ref 11.5–15.5)
WBC: 14.8 10*3/uL — ABNORMAL HIGH (ref 4.0–10.5)
nRBC: 0.2 % (ref 0.0–0.2)

## 2020-09-15 LAB — CBC
HCT: 26.4 % — ABNORMAL LOW (ref 39.0–52.0)
Hemoglobin: 9.8 g/dL — ABNORMAL LOW (ref 13.0–17.0)
MCH: 35.3 pg — ABNORMAL HIGH (ref 26.0–34.0)
MCHC: 37.1 g/dL — ABNORMAL HIGH (ref 30.0–36.0)
MCV: 95 fL (ref 80.0–100.0)
Platelets: 801 10*3/uL — ABNORMAL HIGH (ref 150–400)
RBC: 2.78 MIL/uL — ABNORMAL LOW (ref 4.22–5.81)
RDW: 14.1 % (ref 11.5–15.5)
WBC: 13.1 10*3/uL — ABNORMAL HIGH (ref 4.0–10.5)
nRBC: 0.2 % (ref 0.0–0.2)

## 2020-09-15 LAB — RESP PANEL BY RT-PCR (FLU A&B, COVID) ARPGX2
Influenza A by PCR: NEGATIVE
Influenza B by PCR: NEGATIVE
SARS Coronavirus 2 by RT PCR: POSITIVE — AB

## 2020-09-15 LAB — ABO/RH: ABO/RH(D): A POS

## 2020-09-15 MED ORDER — METHOCARBAMOL 500 MG PO TABS
500.0000 mg | ORAL_TABLET | Freq: Four times a day (QID) | ORAL | Status: DC | PRN
  Administered 2020-09-15 – 2020-09-16 (×2): 500 mg via ORAL
  Filled 2020-09-15 (×2): qty 1

## 2020-09-15 MED ORDER — OXYCODONE HCL 5 MG PO TABS
5.0000 mg | ORAL_TABLET | ORAL | Status: DC | PRN
  Administered 2020-09-15 – 2020-09-16 (×4): 5 mg via ORAL
  Filled 2020-09-15 (×2): qty 1
  Filled 2020-09-15: qty 2
  Filled 2020-09-15 (×2): qty 1

## 2020-09-15 MED ORDER — ONDANSETRON 4 MG PO TBDP
4.0000 mg | ORAL_TABLET | Freq: Four times a day (QID) | ORAL | Status: DC | PRN
  Administered 2020-09-15: 4 mg via ORAL
  Filled 2020-09-15: qty 1

## 2020-09-15 MED ORDER — DOCUSATE SODIUM 100 MG PO CAPS
100.0000 mg | ORAL_CAPSULE | Freq: Two times a day (BID) | ORAL | Status: DC
  Administered 2020-09-15 – 2020-09-16 (×3): 100 mg via ORAL
  Filled 2020-09-15 (×2): qty 1

## 2020-09-15 MED ORDER — PANTOPRAZOLE SODIUM 40 MG PO TBEC
40.0000 mg | DELAYED_RELEASE_TABLET | Freq: Every day | ORAL | Status: DC
  Administered 2020-09-15 – 2020-09-16 (×2): 40 mg via ORAL
  Filled 2020-09-15: qty 1

## 2020-09-15 MED ORDER — ASPIRIN 81 MG PO CHEW
81.0000 mg | CHEWABLE_TABLET | Freq: Every day | ORAL | Status: DC
  Administered 2020-09-15 – 2020-09-16 (×2): 81 mg via ORAL
  Filled 2020-09-15 (×2): qty 1

## 2020-09-15 MED ORDER — ONDANSETRON HCL 4 MG/2ML IJ SOLN: 4.0000 mg | Freq: Four times a day (QID) | INTRAMUSCULAR | Status: DC | PRN

## 2020-09-15 MED ORDER — PANTOPRAZOLE SODIUM 40 MG IV SOLR: 40.0000 mg | Freq: Every day | INTRAVENOUS | Status: DC

## 2020-09-15 MED ORDER — TRAMADOL HCL 50 MG PO TABS
50.0000 mg | ORAL_TABLET | Freq: Four times a day (QID) | ORAL | Status: DC | PRN
Start: 2020-09-15 — End: 2020-09-15

## 2020-09-15 MED ORDER — POLYETHYLENE GLYCOL 3350 17 G PO PACK
17.0000 g | PACK | Freq: Two times a day (BID) | ORAL | Status: DC
  Administered 2020-09-15 – 2020-09-16 (×3): 17 g via ORAL
  Filled 2020-09-15 (×3): qty 1

## 2020-09-15 MED ORDER — MORPHINE SULFATE (PF) 2 MG/ML IV SOLN
2.0000 mg | INTRAVENOUS | Status: DC | PRN
  Administered 2020-09-15: 2 mg via INTRAVENOUS
  Filled 2020-09-15: qty 1

## 2020-09-15 MED ORDER — KCL IN DEXTROSE-NACL 40-5-0.9 MEQ/L-%-% IV SOLN
INTRAVENOUS | Status: DC
  Filled 2020-09-15 (×5): qty 1000

## 2020-09-15 MED ORDER — CIPROFLOXACIN-DEXAMETHASONE 0.3-0.1 % OT SUSP
4.0000 [drp] | Freq: Two times a day (BID) | OTIC | Status: DC
  Administered 2020-09-15 – 2020-09-16 (×2): 4 [drp] via OTIC
  Filled 2020-09-15 (×2): qty 7.5

## 2020-09-15 MED ORDER — ACETAMINOPHEN 500 MG PO TABS: 1000.0000 mg | ORAL_TABLET | Freq: Four times a day (QID) | ORAL | Status: DC | PRN

## 2020-09-15 MED ORDER — ENOXAPARIN SODIUM 30 MG/0.3ML IJ SOSY
30.0000 mg | PREFILLED_SYRINGE | Freq: Two times a day (BID) | INTRAMUSCULAR | Status: DC
  Administered 2020-09-16: 30 mg via SUBCUTANEOUS
  Filled 2020-09-15: qty 0.3

## 2020-09-15 NOTE — ED Notes (Signed)
Breakfast Orders Placed °
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
## 2020-09-15 NOTE — ED Notes (Signed)
Pt tolerated clear liquids without n/v.  Denies pain at this time.

## 2020-09-15 NOTE — ED Notes (Signed)
Denies nausea/vomiting, attempting clear liquids this morning

## 2020-09-15 NOTE — ED Notes (Signed)
Attempt to call report to unit, Nurse not available at this time.  Will try again

## 2020-09-15 NOTE — ED Notes (Signed)
2nd attempt to call report to unit, nurse not available at this time, left message and number to return call.

## 2020-09-16 DIAGNOSIS — Z9889 Other specified postprocedural states: Secondary | ICD-10-CM

## 2020-09-16 MED ORDER — POLYETHYLENE GLYCOL 3350 17 G PO PACK: 17.0000 g | PACK | Freq: Two times a day (BID) | ORAL | 0 refills | Status: AC

## 2020-09-16 MED ORDER — DOCUSATE SODIUM 100 MG PO CAPS: 100.0000 mg | ORAL_CAPSULE | Freq: Two times a day (BID) | ORAL | 0 refills | Status: AC

## 2020-09-16 MED ORDER — MAGNESIUM HYDROXIDE 400 MG/5ML PO SUSP
30.0000 mL | Freq: Once | ORAL | Status: AC
  Administered 2020-09-16: 30 mL via ORAL
  Filled 2020-09-16: qty 30

## 2020-09-16 NOTE — Discharge Instructions (Signed)
Please remain non-weight bearing to the left wrist. Please follow up with your Orthopedist Splint instructions attached  CCS      [facility], [place] [phone]  OPEN ABDOMINAL SURGERY: POST OP INSTRUCTIONS  Always review your discharge instruction sheet given to you by the facility where your surgery was performed.  IF YOU HAVE DISABILITY OR FAMILY LEAVE FORMS, YOU MUST BRING THEM TO THE OFFICE FOR PROCESSING.  PLEASE DO NOT GIVE THEM TO YOUR DOCTOR.  A prescription for pain medication may be given to you upon discharge.  Take your pain medication as prescribed, if needed.  If narcotic pain medicine is not needed, then you may take acetaminophen (Tylenol) or ibuprofen (Advil) as needed. Take your usually prescribed medications unless otherwise directed. If you need a refill on your pain medication, please contact your pharmacy. They will contact our office to request authorization.  Prescriptions will not be filled after 5pm or on week-ends. You should follow a light diet the first few days after arrival home, such as soup and crackers, pudding, etc.unless your doctor has advised otherwise. A high-fiber, low fat diet can be resumed as tolerated.   Be sure to include lots of fluids daily. Most patients will experience some swelling and bruising on the chest and neck area.  Ice packs will help.  Swelling and bruising can take several days to resolve Most patients will experience some swelling and bruising in the area of the incision. Ice pack will help. Swelling and bruising can take several days to resolve..  It is common to experience some constipation if taking pain medication after surgery.  Increasing fluid intake and taking a stool softener will usually help or prevent this problem from occurring.  A mild laxative (Milk of Magnesia or Miralax) should be taken according to package directions if there are no bowel movements after 48 hours.  You may have steri-strips (small skin  tapes) in place directly over the incision.  These strips should be left on the skin for 7-10 days.  If your surgeon used skin glue on the incision, you may shower in 24 hours.  The glue will flake off over the next 2-3 weeks.  Any sutures or staples will be removed at the office during your follow-up visit. You may find that a light gauze bandage over your incision may keep your staples from being rubbed or pulled. You may shower and replace the bandage daily. ACTIVITIES:  You may resume regular (light) daily activities beginning the next day--such as daily self-care, walking, climbing stairs--gradually increasing activities as tolerated.  You may have sexual intercourse when it is comfortable.  Refrain from any heavy lifting or straining until approved by your doctor. You may drive when you no longer are taking prescription pain medication, you can comfortably wear a seatbelt, and you can safely maneuver your car and apply brakes Return to Work: ___________________________________ Bonita Quin should see your doctor in the office for a follow-up appointment approximately two weeks after your surgery.  Make sure that you call for this appointment within a day or two after you arrive home to insure a convenient appointment time. OTHER INSTRUCTIONS:  _____________________________________________________________ _____________________________________________________________  WHEN TO CALL YOUR DOCTOR: Fever over 101.0 Inability to urinate Nausea and/or vomiting Extreme swelling or bruising Continued bleeding from incision. Increased pain, redness, or drainage from the incision. Difficulty swallowing or breathing Muscle cramping or spasms. Numbness or tingling in hands or feet or around lips.  The clinic staff is available to answer your  questions during regular business hours.  Please don't hesitate to call and ask to speak to one of the nurses if you have concerns.  For further questions, please visit  [email or web address]
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
## 2020-09-16 NOTE — Progress Notes (Signed)
Warren Reyes to be D/C'd  per MD order.  Discussed with the patient and all questions fully answered.  VSS, Skin clean, dry and intact without evidence of skin break down, no evidence of skin tears noted.  IV catheter discontinued intact. Site without signs and symptoms of complications. Dressing and pressure applied.  An After Visit Summary was printed and given to the patient. Patient received prescription.  D/c education completed with patient/family including follow up instructions, medication list, d/c activities limitations if indicated, with other d/c instructions as indicated by MD - patient able to verbalize understanding, all questions fully answered.   Patient instructed to return to ED, call 911, or call MD for any changes in condition.   Patient to be escorted via WC, and D/C home via private auto.

## 2020-09-16 NOTE — Plan of Care (Signed)

## 2020-09-16 NOTE — Progress Notes (Signed)
Infection prevention recommended to discontinue isolation today because pt tested positive on 7/31, and he is asymptomatic cpvid positive. This nurse contacted the provider Micheal Maczis PA to get verbal order to discontinue isolation.
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
## 2020-09-22 ENCOUNTER — Inpatient Hospital Stay (HOSPITAL_COMMUNITY)
Admission: AD | Admit: 2020-09-22 | Discharge: 2020-09-24 | DRG: 378 | Disposition: A | Discharge status: Home / Self Care | Payer: No Typology Code available for payment source | Source: Other Acute Inpatient Hospital | Attending: Family Medicine | Admitting: Family Medicine

## 2020-09-22 DIAGNOSIS — S0219XD Other fracture of base of skull, subsequent encounter for fracture with routine healing: Secondary | ICD-10-CM

## 2020-09-22 DIAGNOSIS — Z20822 Contact with and (suspected) exposure to covid-19: Secondary | ICD-10-CM | POA: Diagnosis not present

## 2020-09-22 DIAGNOSIS — S0240FD Zygomatic fracture, left side, subsequent encounter for fracture with routine healing: Secondary | ICD-10-CM

## 2020-09-22 DIAGNOSIS — S62102D Fracture of unspecified carpal bone, left wrist, subsequent encounter for fracture with routine healing: Secondary | ICD-10-CM | POA: Diagnosis not present

## 2020-09-22 DIAGNOSIS — T454X5A Adverse effect of iron and its compounds, initial encounter: Secondary | ICD-10-CM | POA: Diagnosis not present

## 2020-09-22 DIAGNOSIS — Z7982 Long term (current) use of aspirin: Secondary | ICD-10-CM

## 2020-09-22 DIAGNOSIS — D62 Acute posthemorrhagic anemia: Secondary | ICD-10-CM | POA: Diagnosis not present

## 2020-09-22 DIAGNOSIS — K2990 Gastroduodenitis, unspecified, without bleeding: Secondary | ICD-10-CM | POA: Diagnosis present

## 2020-09-22 DIAGNOSIS — Z809 Family history of malignant neoplasm, unspecified: Secondary | ICD-10-CM

## 2020-09-22 DIAGNOSIS — Z833 Family history of diabetes mellitus: Secondary | ICD-10-CM | POA: Diagnosis not present

## 2020-09-22 DIAGNOSIS — R55 Syncope and collapse: Secondary | ICD-10-CM | POA: Diagnosis present

## 2020-09-22 DIAGNOSIS — Z825 Family history of asthma and other chronic lower respiratory diseases: Secondary | ICD-10-CM | POA: Diagnosis not present

## 2020-09-22 DIAGNOSIS — S27329D Contusion of lung, unspecified, subsequent encounter: Secondary | ICD-10-CM | POA: Diagnosis not present

## 2020-09-22 DIAGNOSIS — Z8249 Family history of ischemic heart disease and other diseases of the circulatory system: Secondary | ICD-10-CM

## 2020-09-22 DIAGNOSIS — S2249XD Multiple fractures of ribs, unspecified side, subsequent encounter for fracture with routine healing: Secondary | ICD-10-CM

## 2020-09-22 DIAGNOSIS — K922 Gastrointestinal hemorrhage, unspecified: Secondary | ICD-10-CM | POA: Diagnosis present

## 2020-09-22 DIAGNOSIS — S32412D Displaced fracture of anterior wall of left acetabulum, subsequent encounter for fracture with routine healing: Secondary | ICD-10-CM

## 2020-09-22 DIAGNOSIS — K2971 Gastritis, unspecified, with bleeding: Secondary | ICD-10-CM | POA: Diagnosis not present

## 2020-09-22 DIAGNOSIS — Z9081 Acquired absence of spleen: Secondary | ICD-10-CM

## 2020-09-22 DIAGNOSIS — Z79899 Other long term (current) drug therapy: Secondary | ICD-10-CM

## 2020-09-22 DIAGNOSIS — R011 Cardiac murmur, unspecified: Secondary | ICD-10-CM | POA: Diagnosis not present

## 2020-09-22 DIAGNOSIS — K2970 Gastritis, unspecified, without bleeding: Secondary | ICD-10-CM

## 2020-09-22 DIAGNOSIS — K5903 Drug induced constipation: Secondary | ICD-10-CM | POA: Diagnosis present

## 2020-09-22 DIAGNOSIS — Z9889 Other specified postprocedural states: Secondary | ICD-10-CM

## 2020-09-22 LAB — BASIC METABOLIC PANEL
Anion gap: 7 (ref 5–15)
BUN: 10 mg/dL (ref 6–20)
CO2: 24 mmol/L (ref 22–32)
Calcium: 8 mg/dL — ABNORMAL LOW (ref 8.9–10.3)
Chloride: 107 mmol/L (ref 98–111)
Creatinine, Ser: 0.82 mg/dL (ref 0.61–1.24)
GFR, Estimated: >60 mL/min (ref >60)
Glucose, Bld: 110 mg/dL — ABNORMAL HIGH (ref 70–99)
Potassium: 3.5 mmol/L (ref 3.5–5.1)
Sodium: 138 mmol/L (ref 135–145)

## 2020-09-22 LAB — HEMOGLOBIN AND HEMATOCRIT, BLOOD
HCT: 24.3 % — ABNORMAL LOW (ref 39.0–52.0)
Hemoglobin: 8.3 g/dL — ABNORMAL LOW (ref 13.0–17.0)

## 2020-09-22 MED ORDER — ONDANSETRON HCL 4 MG PO TABS: 4.0000 mg | ORAL_TABLET | Freq: Four times a day (QID) | ORAL | Status: DC | PRN

## 2020-09-22 MED ORDER — POLYETHYLENE GLYCOL 3350 17 G PO PACK
17.0000 g | PACK | Freq: Every day | ORAL | Status: DC
  Filled 2020-09-22 (×2): qty 1

## 2020-09-22 MED ORDER — LACTATED RINGERS IV SOLN: INTRAVENOUS | Status: DC

## 2020-09-22 MED ORDER — PANTOPRAZOLE SODIUM 40 MG IV SOLR
40.0000 mg | Freq: Two times a day (BID) | INTRAVENOUS | Status: DC
  Administered 2020-09-22 – 2020-09-23 (×2): 40 mg via INTRAVENOUS
  Filled 2020-09-22 (×2): qty 40

## 2020-09-22 MED ORDER — FENTANYL CITRATE (PF) 100 MCG/2ML IJ SOLN: 12.5000 ug | INTRAMUSCULAR | Status: DC | PRN

## 2020-09-22 MED ORDER — OXYCODONE HCL 5 MG PO TABS
5.0000 mg | ORAL_TABLET | Freq: Four times a day (QID) | ORAL | Status: DC | PRN
Start: 2020-09-22 — End: 2020-09-24
  Administered 2020-09-23 – 2020-09-24 (×2): 5 mg via ORAL
  Filled 2020-09-22 (×2): qty 1

## 2020-09-22 MED ORDER — ONDANSETRON HCL 4 MG/2ML IJ SOLN: 4.0000 mg | Freq: Four times a day (QID) | INTRAMUSCULAR | Status: DC | PRN

## 2020-09-22 MED ORDER — BACITRACIN ZINC 500 UNIT/GM EX OINT
TOPICAL_OINTMENT | Freq: Two times a day (BID) | CUTANEOUS | Status: DC
  Administered 2020-09-23: 1 via TOPICAL
  Filled 2020-09-22: qty 28.4

## 2020-09-22 MED ORDER — FERROUS SULFATE 325 (65 FE) MG PO TABS
325.0000 mg | ORAL_TABLET | Freq: Three times a day (TID) | ORAL | Status: DC
  Administered 2020-09-23 (×2): 325 mg via ORAL
  Filled 2020-09-22 (×4): qty 1

## 2020-09-22 MED ORDER — ASCORBIC ACID 500 MG PO TABS
500.0000 mg | ORAL_TABLET | Freq: Three times a day (TID) | ORAL | Status: DC
  Administered 2020-09-22 – 2020-09-24 (×4): 500 mg via ORAL
  Filled 2020-09-22 (×5): qty 1

## 2020-09-22 MED ORDER — METHOCARBAMOL 500 MG PO TABS: 1000.0000 mg | ORAL_TABLET | Freq: Three times a day (TID) | ORAL | Status: DC | PRN

## 2020-09-22 MED ORDER — DOCUSATE SODIUM 100 MG PO CAPS
100.0000 mg | ORAL_CAPSULE | Freq: Two times a day (BID) | ORAL | Status: DC
  Administered 2020-09-23 – 2020-09-24 (×2): 100 mg via ORAL
  Filled 2020-09-22 (×2): qty 1

## 2020-09-22 NOTE — Progress Notes (Signed)
Contacted infection prevention and they cleared pt to come off of isolation on 09/22/20 @ 1708.
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
## 2020-09-22 NOTE — Progress Notes (Signed)
Transition of Care [facility]) - CAGE-AID Screening   Patient Details  Name: Warren Reyes MRN: [id number] Date of Birth: [birth year]   Clinical Narrative:  Pt denies drug and alcohol use.   CAGE-AID Screening:    Have You Ever Felt You Ought to Cut Down on Your Drinking or Drug Use?: No Have People Annoyed You By Critizing Your Drinking Or Drug Use?: No Have You Felt Bad Or Guilty About Your Drinking Or Drug Use?: No Have You Ever Had a Drink or Used Drugs First Thing In The Morning to Steady Your Nerves or to Get Rid of a Hangover?: No CAGE-AID Score: 0  Substance Abuse Education Offered: No
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
## 2020-09-23 ENCOUNTER — Encounter (HOSPITAL_COMMUNITY): Payer: Self-pay | Admitting: Internal Medicine

## 2020-09-23 ENCOUNTER — Observation Stay (HOSPITAL_COMMUNITY): Payer: No Typology Code available for payment source | Admitting: Anesthesiology

## 2020-09-23 ENCOUNTER — Encounter (HOSPITAL_COMMUNITY)
Admission: AD | Disposition: A | Discharge status: Home / Self Care | Payer: Self-pay | Source: Other Acute Inpatient Hospital | Attending: Family Medicine

## 2020-09-23 DIAGNOSIS — S0240FD Zygomatic fracture, left side, subsequent encounter for fracture with routine healing: Secondary | ICD-10-CM | POA: Diagnosis not present

## 2020-09-23 DIAGNOSIS — S27329D Contusion of lung, unspecified, subsequent encounter: Secondary | ICD-10-CM | POA: Diagnosis not present

## 2020-09-23 DIAGNOSIS — Z7982 Long term (current) use of aspirin: Secondary | ICD-10-CM | POA: Diagnosis not present

## 2020-09-23 DIAGNOSIS — K5903 Drug induced constipation: Secondary | ICD-10-CM | POA: Diagnosis present

## 2020-09-23 DIAGNOSIS — K2970 Gastritis, unspecified, without bleeding: Secondary | ICD-10-CM

## 2020-09-23 DIAGNOSIS — K2961 Other gastritis with bleeding: Secondary | ICD-10-CM

## 2020-09-23 DIAGNOSIS — T454X5A Adverse effect of iron and its compounds, initial encounter: Secondary | ICD-10-CM | POA: Diagnosis present

## 2020-09-23 DIAGNOSIS — R55 Syncope and collapse: Secondary | ICD-10-CM | POA: Diagnosis present

## 2020-09-23 DIAGNOSIS — Z20822 Contact with and (suspected) exposure to covid-19: Secondary | ICD-10-CM | POA: Diagnosis present

## 2020-09-23 DIAGNOSIS — R011 Cardiac murmur, unspecified: Secondary | ICD-10-CM | POA: Diagnosis present

## 2020-09-23 DIAGNOSIS — Z8249 Family history of ischemic heart disease and other diseases of the circulatory system: Secondary | ICD-10-CM | POA: Diagnosis not present

## 2020-09-23 DIAGNOSIS — S62102D Fracture of unspecified carpal bone, left wrist, subsequent encounter for fracture with routine healing: Secondary | ICD-10-CM | POA: Diagnosis not present

## 2020-09-23 DIAGNOSIS — S32412D Displaced fracture of anterior wall of left acetabulum, subsequent encounter for fracture with routine healing: Secondary | ICD-10-CM | POA: Diagnosis not present

## 2020-09-23 DIAGNOSIS — D62 Acute posthemorrhagic anemia: Secondary | ICD-10-CM | POA: Diagnosis present

## 2020-09-23 DIAGNOSIS — Z833 Family history of diabetes mellitus: Secondary | ICD-10-CM | POA: Diagnosis not present

## 2020-09-23 DIAGNOSIS — S2249XD Multiple fractures of ribs, unspecified side, subsequent encounter for fracture with routine healing: Secondary | ICD-10-CM | POA: Diagnosis not present

## 2020-09-23 DIAGNOSIS — K2990 Gastroduodenitis, unspecified, without bleeding: Secondary | ICD-10-CM | POA: Diagnosis present

## 2020-09-23 DIAGNOSIS — Z9081 Acquired absence of spleen: Secondary | ICD-10-CM | POA: Diagnosis not present

## 2020-09-23 DIAGNOSIS — Z825 Family history of asthma and other chronic lower respiratory diseases: Secondary | ICD-10-CM | POA: Diagnosis not present

## 2020-09-23 DIAGNOSIS — K921 Melena: Secondary | ICD-10-CM

## 2020-09-23 DIAGNOSIS — Z79899 Other long term (current) drug therapy: Secondary | ICD-10-CM | POA: Diagnosis not present

## 2020-09-23 DIAGNOSIS — Z809 Family history of malignant neoplasm, unspecified: Secondary | ICD-10-CM | POA: Diagnosis not present

## 2020-09-23 DIAGNOSIS — S0219XD Other fracture of base of skull, subsequent encounter for fracture with routine healing: Secondary | ICD-10-CM | POA: Diagnosis not present

## 2020-09-23 DIAGNOSIS — K922 Gastrointestinal hemorrhage, unspecified: Secondary | ICD-10-CM

## 2020-09-23 DIAGNOSIS — K2971 Gastritis, unspecified, with bleeding: Secondary | ICD-10-CM | POA: Diagnosis present

## 2020-09-23 HISTORY — PX: BIOPSY: SHX5522

## 2020-09-23 HISTORY — PX: ESOPHAGOGASTRODUODENOSCOPY: SHX5428

## 2020-09-23 LAB — HEMOGLOBIN AND HEMATOCRIT, BLOOD
HCT: 22.2 % — ABNORMAL LOW (ref 39.0–52.0)
HCT: 21.6 % — ABNORMAL LOW (ref 39.0–52.0)
Hemoglobin: 7.4 g/dL — ABNORMAL LOW (ref 13.0–17.0)
Hemoglobin: 7.3 g/dL — ABNORMAL LOW (ref 13.0–17.0)

## 2020-09-23 LAB — BASIC METABOLIC PANEL
Anion gap: 6 (ref 5–15)
BUN: 10 mg/dL (ref 6–20)
CO2: 24 mmol/L (ref 22–32)
Calcium: 8.4 mg/dL — ABNORMAL LOW (ref 8.9–10.3)
Chloride: 108 mmol/L (ref 98–111)
Creatinine, Ser: 0.75 mg/dL (ref 0.61–1.24)
GFR, Estimated: >60 mL/min (ref >60)
Glucose, Bld: 94 mg/dL (ref 70–99)
Potassium: 3.8 mmol/L (ref 3.5–5.1)
Sodium: 138 mmol/L (ref 135–145)

## 2020-09-23 SURGERY — EGD (ESOPHAGOGASTRODUODENOSCOPY)
Anesthesia: Monitor Anesthesia Care

## 2020-09-23 MED ORDER — PROPOFOL 500 MG/50ML IV EMUL
INTRAVENOUS | Status: DC | PRN
  Administered 2020-09-23: 200 ug/kg/min via INTRAVENOUS

## 2020-09-23 MED ORDER — LACTATED RINGERS IV SOLN: INTRAVENOUS | Status: DC | PRN

## 2020-09-23 MED ORDER — LIDOCAINE 2% (20 MG/ML) 5 ML SYRINGE
INTRAMUSCULAR | Status: DC | PRN
  Administered 2020-09-23: 100 mg via INTRAVENOUS

## 2020-09-23 MED ORDER — WHITE PETROLATUM EX OINT
TOPICAL_OINTMENT | CUTANEOUS | Status: AC
  Filled 2020-09-23: qty 28.35

## 2020-09-23 MED ORDER — PROPOFOL 10 MG/ML IV BOLUS
INTRAVENOUS | Status: DC | PRN
  Administered 2020-09-23: 50 mg via INTRAVENOUS

## 2020-09-23 MED ORDER — PANTOPRAZOLE SODIUM 40 MG PO TBEC
40.0000 mg | DELAYED_RELEASE_TABLET | Freq: Every day | ORAL | Status: DC
  Administered 2020-09-23 – 2020-09-24 (×2): 40 mg via ORAL
  Filled 2020-09-23 (×2): qty 1

## 2020-09-23 MED ORDER — SODIUM CHLORIDE 0.9 % IV SOLN: INTRAVENOUS | Status: DC

## 2020-09-23 MED ORDER — SUCRALFATE 1 GM/10ML PO SUSP
1.0000 g | Freq: Three times a day (TID) | ORAL | Status: DC
  Administered 2020-09-23 – 2020-09-24 (×2): 1 g via ORAL
  Filled 2020-09-23 (×5): qty 10

## 2020-09-23 MED ORDER — PHENYLEPHRINE HCL (PRESSORS) 10 MG/ML IV SOLN
INTRAVENOUS | Status: DC | PRN
  Administered 2020-09-23 (×2): 40 ug via INTRAVENOUS

## 2020-09-23 NOTE — Anesthesia Postprocedure Evaluation (Signed)
Anesthesia Post Note  Patient: Warren Reyes  Procedure(s) Performed: ESOPHAGOGASTRODUODENOSCOPY (EGD) BIOPSY     Patient location during evaluation: PACU Anesthesia Type: MAC Level of consciousness: awake and alert Pain management: pain level controlled Vital Signs Assessment: post-procedure vital signs reviewed and stable Respiratory status: spontaneous breathing, nonlabored ventilation, respiratory function stable and patient connected to nasal cannula oxygen Cardiovascular status: stable and blood pressure returned to baseline Postop Assessment: no apparent nausea or vomiting Anesthetic complications: no   No notable events documented.  Last Vitals:  Vitals:   09/23/20 1558 09/23/20 1610  BP: 114/67 (!) 118/52  Pulse: 84 77  Resp: 19 13  Temp:    SpO2: 100% 100%    Last Pain:  Vitals:   09/23/20 1610  TempSrc:   PainSc: 0-No pain                 [name]
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
## 2020-09-23 NOTE — Op Note (Signed)
[facility] Patient Name: Warren Reyes Procedure Date : 09/23/2020 MRN: [id number] Attending MD: Dub Amis. Tomasa Rand , MD Date of Birth: 06-Apr-1995 CSN: [id number] Age: 25 Admit Type: Inpatient Procedure:                Upper GI endoscopy Indications:              Acute post hemorrhagic anemia, Suspected upper                            gastrointestinal bleeding Providers:                Lorin Picket E. Tomasa Rand, MD, Roselie Awkward, RN, Alan Ripper, Technician, Loma Sender, CRNA Referring MD:              Medicines:                Monitored Anesthesia Care Complications:            No immediate complications. Estimated Blood Loss:     Estimated blood loss was minimal. Procedure:                Pre-Anesthesia Assessment:                           - Prior to the procedure, a History and Physical                            was performed, and patient medications and                            allergies were reviewed. The patient's tolerance of                            previous anesthesia was also reviewed. The risks                            and benefits of the procedure and the sedation                            options and risks were discussed with the patient.                            All questions were answered, and informed consent                            was obtained. Prior Anticoagulants: The patient has                            taken no previous anticoagulant or antiplatelet                            agents. ASA Grade Assessment: III - A patient with  severe systemic disease. After reviewing the risks                            and benefits, the patient was deemed in                            satisfactory condition to undergo the procedure.                           After obtaining informed consent, the endoscope was                            passed under direct vision. Throughout the                             procedure, the patient's blood pressure, pulse, and                            oxygen saturations were monitored continuously. The                            [id number]-H190 ([id number] Olympus endoscope was introduced                            through the mouth, and advanced to the third part                            of duodenum. The upper GI endoscopy was                            accomplished without difficulty. The patient                            tolerated the procedure well. Scope In: Scope Out: Findings:      The examined esophagus was normal.      Localized moderate inflammation with hemorrhage characterized by       erythema and friability (with contact bleeding from water lavage) was       found in the gastric body. Biopsies were taken with a cold forceps for       histology. Estimated blood loss was minimal.      The exam of the stomach was otherwise normal.      The examined duodenum was normal. Impression:               - Normal esophagus.                           - Presumed caustic gastritis with hemorrhage.                            Biopsied. Query history of oral iron                            supplementation or other gastric irritant. No high  risk lesions present                           - Normal examined duodenum. Recommendation:           - Return patient to hospital ward for ongoing care.                           - Resume previous diet.                           - Continue present medications.                           - Await pathology results.                           - Use sucralfate suspension 1 gram PO QID for 1                            week.                           - Recommend PPI once daily for 4 weeks.                           - Recommend observation overnight and if no further                            evidence bleeding, can discharge home. Procedure Code(s):        --- Professional ---                            [phone], Esophagogastroduodenoscopy, flexible,                            transoral; with biopsy, single or multiple Diagnosis Code(s):        --- Professional ---                           D62, Acute posthemorrhagic anemia CPT copyright 2019 American Medical Association. All rights reserved. The codes documented in this report are preliminary and upon coder review may  be revised to meet current compliance requirements. Teddy Rebstock E. Tomasa Rand, MD 09/23/2020 3:52:53 PM This report has been signed electronically. Number of Addenda: 0

## 2020-09-23 NOTE — Progress Notes (Signed)
PT Cancellation Note  Patient Details Name: Warren Reyes MRN: [id number] DOB: 1995/06/02   Cancelled Treatment:    Reason Eval/Treat Not Completed: Patient at procedure or test/unavailable (upper endoscopy).  Lillia Pauls, PT, DPT Acute Rehabilitation Services Pager [phone] Office [phone]    Norval Morton 09/23/2020, 1:59 PM
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
## 2020-09-23 NOTE — Transfer of Care (Signed)
Immediate Anesthesia Transfer of Care Note  Patient: Warren Reyes  Procedure(s) Performed: ESOPHAGOGASTRODUODENOSCOPY (EGD) BIOPSY  Patient Location: PACU  Anesthesia Type:MAC  Level of Consciousness: drowsy and patient cooperative  Airway & Oxygen Therapy: Patient Spontanous Breathing  Post-op Assessment: Report given to RN and Post -op Vital signs reviewed and stable  Post vital signs: Reviewed and stable  Last Vitals:  Vitals Value Taken Time  BP 111/51 09/23/20 1550  Temp 36.9 C 09/23/20 1549  Pulse 80 09/23/20 1555  Resp 18 09/23/20 1555  SpO2 100 % 09/23/20 1555  Vitals shown include unvalidated device data.  Last Pain:  Vitals:   09/23/20 1549  TempSrc: Temporal  PainSc: 0-No pain         Complications: No notable events documented.
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
## 2020-09-24 ENCOUNTER — Encounter (HOSPITAL_COMMUNITY): Payer: Self-pay | Admitting: Gastroenterology

## 2020-09-24 ENCOUNTER — Other Ambulatory Visit (HOSPITAL_COMMUNITY): Payer: Self-pay

## 2020-09-24 LAB — CBC
HCT: 24.3 % — ABNORMAL LOW (ref 39.0–52.0)
Hemoglobin: 8 g/dL — ABNORMAL LOW (ref 13.0–17.0)
MCH: 28.3 pg (ref 26.0–34.0)
MCHC: 32.9 g/dL (ref 30.0–36.0)
MCV: 85.9 fL (ref 80.0–100.0)
Platelets: 473 10*3/uL — ABNORMAL HIGH (ref 150–400)
RBC: 2.83 MIL/uL — ABNORMAL LOW (ref 4.22–5.81)
RDW: 16.8 % — ABNORMAL HIGH (ref 11.5–15.5)
WBC: 9.8 10*3/uL (ref 4.0–10.5)
nRBC: 0.9 % — ABNORMAL HIGH (ref 0.0–0.2)

## 2020-09-24 LAB — RETICULOCYTES
Immature Retic Fract: 37.6 % — ABNORMAL HIGH (ref 2.3–15.9)
RBC.: 2.65 MIL/uL — ABNORMAL LOW (ref 4.22–5.81)
Retic Count, Absolute: 280 10*3/uL — ABNORMAL HIGH (ref 19.0–186.0)
Retic Ct Pct: 10.6 % — ABNORMAL HIGH (ref 0.4–3.1)

## 2020-09-24 MED ORDER — SUCRALFATE 1 GM/10ML PO SUSP
1.0000 g | Freq: Three times a day (TID) | ORAL | 0 refills | Status: AC
  Filled 2020-09-24: qty 280, 7d supply, fill #0

## 2020-09-24 MED ORDER — SUCRALFATE 1 GM/10ML PO SUSP: 1.0000 g | Freq: Three times a day (TID) | ORAL | 0 refills | Status: DC

## 2020-09-24 MED ORDER — SODIUM CHLORIDE 0.9 % IV SOLN
125.0000 mg | Freq: Once | INTRAVENOUS | Status: AC
  Administered 2020-09-24: 125 mg via INTRAVENOUS
  Filled 2020-09-24: qty 10

## 2020-09-24 MED ORDER — PANTOPRAZOLE SODIUM 40 MG PO TBEC
40.0000 mg | DELAYED_RELEASE_TABLET | Freq: Every day | ORAL | 0 refills | Status: AC
  Filled 2020-09-24: qty 28, 28d supply, fill #0

## 2020-09-24 MED ORDER — PANTOPRAZOLE SODIUM 40 MG PO TBEC: 40.0000 mg | DELAYED_RELEASE_TABLET | Freq: Every day | ORAL | 0 refills | Status: DC

## 2020-09-24 NOTE — Discharge Instructions (Signed)
Warren Reyes,  You were in the hospital for low blood count which was secondary to GI bleeding from your stomach. Your endoscopy showed a lot of inflammation (gastritis) with some mild hemorrhage (bleeding). Recommendation is for you to continue Carafate and Protonix as prescribed. The GI doctor also obtained a biopsy (tissue sample) and will follow-up with you with regard to the results. You can also follow-up with him as needed if your blood counts do not improve. Prior to you discharging, you were given IV iron to help your body make red blood cells.   Incidentally, I heard a murmur in your heart and a bruit in your abdomen. These are likely related to your low blood counts and should improve. If they do not improve, your PCP may need to obtain ultrasounds.

## 2020-09-24 NOTE — Evaluation (Signed)
Physical Therapy Evaluation Patient Details Name: Warren Reyes MRN: [id number] DOB: 01/27/1996 Today's Date: 09/24/2020   History of Present Illness  The pt is a 25 yo male presenting 8/16 due to syncopal episode at home. Hgb found to be 6.2 and BP 95/60 on admission. The pt was reccently admitted 7/31-8/4 due to [facility] in wich he sustained L wrist fx s/p ORIF on 8/2, L and R acetabular fx, G5 splenic injury, Liver lceration, L ZMC/sphenoid/lat orbital roof/ frontal sinus fx's, fx of anterior wall of R ext auditoryu canal, and temporal bone fx. PMH includes: exlap on 7/31.   Clinical Impression  Pt in bed upon arrival of PT, agreeable to evaluation at this time. Prior to admission the pt was mobilizing independently at home with no need for additional DME or assistance for mobility or ADLs. The pt now presents with minor limitations in functional mobility and cardiovascular endurance due to above dx, but is safe to return home with intermittent supervision from family at this point. The pt was able to complete bed mobility, and multiple sit-stand transfers within the room independently, with no need for UE support or any evidence of instability. The pt was also able to complete hallway ambulation and a flight of stairs with no UE support and supervision for safety. The pt does a great job of self-monitoring for HR elevation and exertion (HR increased to 140 bpm with flight of stairs, but returned to 120s with standing rest and continuation of normal walking). All education complete, all VSS through session. Pt and his mother with no additional questions or concerns related to mobility at home, is safe from mobility standpoint to return home when medically stable. No further acute PT needs at this time, thank you for the consult.      Follow Up Recommendations No PT follow up;Supervision - Intermittent    Equipment Recommendations  None recommended by PT    Recommendations for Other Services        Precautions / Restrictions Precautions Precautions: Fall Precaution Comments: COVID + on 8/9 Required Braces or Orthoses: Splint/Cast Splint/Cast: L forearm Restrictions Weight Bearing Restrictions: Yes LUE Weight Bearing: Weight bear through elbow only (NWB through wrist/hand) RLE Weight Bearing: Weight bearing as tolerated LLE Weight Bearing: Weight bearing as tolerated Other Position/Activity Restrictions: LUE NWB through hand and wrist      Mobility  Bed Mobility Overal bed mobility: Independent             General bed mobility comments: pt mobilizing to EOB without assist, HOB elevated but suspect pt can complete from [facility] flat    Transfers Overall transfer level: Independent Equipment used: None   Sit to Stand: Supervision;Independent         General transfer comment: supervision during session for safety, pt able to complete multiple times with no evidence of instability without UE suport  Ambulation/Gait Ambulation/Gait assistance: Independent Gait Distance (Feet): 150 Feet Assistive device: None Gait Pattern/deviations: WFL(Within Functional Limits) Gait velocity: 1.0 m/s Gait velocity interpretation: 1.31 - 2.62 ft/sec, indicative of limited community ambulator General Gait Details: good stability, pt self-monitoring for HR elevation  Stairs Stairs: Yes Stairs assistance: Supervision Stair Management: No rails;Alternating pattern;Forwards Number of Stairs: 12 General stair comments: pt able to complete without evidence of instability, no physical assist. pt only with L rail at home, able to manage without rails      Balance Overall balance assessment: No apparent balance deficits (not formally assessed)  Pertinent Vitals/Pain Pain Assessment: Faces Faces Pain Scale: Hurts a little bit Pain Location: L wrist, L hip after mobility Pain Descriptors / Indicators:  Aching;Discomfort;Grimacing;Guarding Pain Intervention(s): Limited activity within patient's tolerance;Monitored during session;Repositioned    Home Living Family/patient expects to be discharged to:: Private residence Living Arrangements: Alone Available Help at Discharge: Family;Friend(s);Available PRN/intermittently Type of Home: Apartment Home Access: Stairs to enter Entrance Stairs-Rails: Left Entrance Stairs-Number of Steps: 1 flight Home Layout: One level Home Equipment: None      Prior Function Level of Independence: Independent               Hand Dominance   Dominant Hand: Left    Extremity/Trunk Assessment   Upper Extremity Assessment Upper Extremity Assessment: Defer to OT evaluation LUE Deficits / Details: ORIF of wrist fx    Lower Extremity Assessment Lower Extremity Assessment: Overall WFL for tasks assessed    Cervical / Trunk Assessment Cervical / Trunk Assessment: Normal  Communication   Communication: No difficulties  Cognition Arousal/Alertness: Awake/alert Behavior During Therapy: WFL for tasks assessed/performed Overall Cognitive Status: Within Functional Limits for tasks assessed                                 General Comments: pt pleasant and cooperative, good safety insight and self regulation of HR      General Comments General comments (skin integrity, edema, etc.): HR to 140 bpm following stair navigation, pt able to recover to 120s with standing rest and return to casual walking. BP stable throughout    Exercises     Assessment/Plan    PT Assessment Patent does not need any further PT services  PT Problem List Decreased mobility;Decreased knowledge of precautions;Decreased range of motion;Decreased activity tolerance;Decreased balance       PT Treatment Interventions      PT Goals (Current goals can be found in the Care Plan section)  Acute Rehab PT Goals Patient Stated Goal: To eat something PT Goal  Formulation: With patient/family Time For Goal Achievement: 10/01/20 Potential to Achieve Goals: Good     AM-PAC PT "6 Clicks" Mobility  Outcome Measure Help needed turning from your back to your side while in a flat bed without using bedrails?: None Help needed moving from lying on your back to sitting on the side of a flat bed without using bedrails?: None Help needed moving to and from a bed to a chair (including a wheelchair)?: None Help needed standing up from a chair using your arms (e.g., wheelchair or bedside chair)?: None Help needed to walk in hospital room?: None Help needed climbing 3-5 steps with a railing? : A Little 6 Click Score: 23    End of Session Equipment Utilized During Treatment: Gait belt Activity Tolerance: Patient tolerated treatment well Patient left: with call bell/phone within reach;with family/visitor present;in bed Nurse Communication: Mobility status PT Visit Diagnosis: Other abnormalities of gait and mobility (R26.89)    Time: 3419-3790 PT Time Calculation (min) (ACUTE ONLY): 27 min   Charges:     PT Treatments $Gait Training: 8-22 mins        Lazarus Gowda, PT, DPT   Acute Rehabilitation Department Pager #: [phone]  Ronnie Derby 09/24/2020, 8:53 AM

## 2020-09-24 NOTE — Progress Notes (Signed)
Pt with discharge orders, discharge paperwork reviewed with patient and all questions answered. IV's removed, medications delivered from [facility] pharmacy prior to discharge. Pt given all equipment and taken down via wheelchair with all belongings.   
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
## 2020-09-25 LAB — SURGICAL PATHOLOGY

## 2020-09-29 ENCOUNTER — Ambulatory Visit (INDEPENDENT_AMBULATORY_CARE_PROVIDER_SITE_OTHER): Payer: Self-pay | Admitting: Family Medicine

## 2020-09-29 ENCOUNTER — Other Ambulatory Visit: Payer: Self-pay

## 2020-09-29 ENCOUNTER — Encounter: Payer: Self-pay | Admitting: Family Medicine

## 2020-09-29 VITALS — BP 114/71 | HR 109 | Temp 98.8°F | Ht 67.0 in | Wt 122.2 lb

## 2020-09-29 DIAGNOSIS — S4992XA Unspecified injury of left shoulder and upper arm, initial encounter: Secondary | ICD-10-CM
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
## 2020-09-29 NOTE — Progress Notes (Signed)
Hospital fup
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
## 2020-10-13 ENCOUNTER — Encounter: Payer: Self-pay | Admitting: Gastroenterology

## 2020-10-28 ENCOUNTER — Other Ambulatory Visit: Payer: Self-pay

## 2020-10-28 ENCOUNTER — Ambulatory Visit: Payer: Self-pay | Attending: Orthopedic Surgery

## 2020-10-28 DIAGNOSIS — M6281 Muscle weakness (generalized): Secondary | ICD-10-CM | POA: Insufficient documentation

## 2020-10-28 DIAGNOSIS — M25532 Pain in left wrist: Secondary | ICD-10-CM | POA: Insufficient documentation
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
## 2020-11-04 ENCOUNTER — Encounter: Payer: Self-pay | Admitting: Physical Therapy

## 2020-11-04 ENCOUNTER — Other Ambulatory Visit: Payer: Self-pay

## 2020-11-04 ENCOUNTER — Ambulatory Visit: Payer: Medicaid Other | Admitting: Physical Therapy

## 2020-11-04 DIAGNOSIS — M25532 Pain in left wrist: Secondary | ICD-10-CM

## 2020-11-04 DIAGNOSIS — M6281 Muscle weakness (generalized): Secondary | ICD-10-CM
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
## 2020-11-06 ENCOUNTER — Ambulatory Visit: Payer: Self-pay | Admitting: Physical Therapy

## 2020-11-11 ENCOUNTER — Ambulatory Visit: Payer: No Typology Code available for payment source | Attending: Orthopedic Surgery

## 2020-11-11 ENCOUNTER — Other Ambulatory Visit: Payer: Self-pay

## 2020-11-11 DIAGNOSIS — M25532 Pain in left wrist: Secondary | ICD-10-CM | POA: Diagnosis present

## 2020-11-11 DIAGNOSIS — M6281 Muscle weakness (generalized): Secondary | ICD-10-CM | POA: Insufficient documentation

## 2020-11-11 NOTE — Therapy (Signed)
[facility] Outpatient Rehabilitation [facility] [address] [place], [place], [address] Phone: [phone]   Fax:  [phone]  Physical Therapy Treatment  Patient Details  Name: Warren Reyes MRN: [id number] Date of Birth: 1995/08/31 Referring Provider (PT): Myrene Galas, MD   Encounter Date: 11/11/2020   PT End of Session - 11/11/20 1640     Visit Number 3    Number of Visits 17    Date for PT Re-Evaluation 12/23/20    Authorization Type Self Pay - FOTO 6th and 10th    PT Start Time 1639    PT Stop Time 1702    PT Time Calculation (min) 23 min    Activity Tolerance Patient tolerated treatment well;No increased pain    Behavior During Therapy [facility] for tasks assessed/performed             No past medical history on file.  Past Surgical History:  Procedure Laterality Date   BIOPSY  09/23/2020   Procedure: BIOPSY;  Surgeon: Jenel Lucks, MD;  Location: [facility] ENDOSCOPY;  Service: Gastroenterology;;   ESOPHAGOGASTRODUODENOSCOPY N/A 09/23/2020   Procedure: ESOPHAGOGASTRODUODENOSCOPY (EGD);  Surgeon: Jenel Lucks, MD;  Location: [facility] ENDOSCOPY;  Service: Gastroenterology;  Laterality: N/A;   extraction of wisdom teeth      LACERATION REPAIR Left 09/06/2020   Procedure: LACERATION REPAIR;  Surgeon: [name], MD;  Location: [facility] OR;  Service: General;  Laterality: Left;   LAPAROTOMY N/A 09/06/2020   Procedure: EXPLORATORY LAPAROTOMY with Splenectomy;  Surgeon: [name], MD;  Location: [facility] OR;  Service: General;  Laterality: N/A;   ORIF WRIST FRACTURE Left 09/08/2020   Procedure: OPEN REDUCTION INTERNAL FIXATION (ORIF) WRIST FRACTURE;  Surgeon: Myrene Galas, MD;  Location: [facility] OR;  Service: Orthopedics;  Laterality: Left;    There were no vitals filed for this visit.   Subjective Assessment - 11/11/20 1640     Subjective Pt presents to PT with reports of continued L wrist pain and discomfort. He also notes some continued lingering L  hip pain.    Currently in Pain? Yes    Pain Score 3     Pain Location Wrist    Pain Orientation Left    Pain Score 2    Pain Location Pelvis    Pain Orientation Left           [facility] Adult PT Treatment/Exercise:   Therapeutic Exercise: - UBE lvl 2.0 x 2 min while taking subjective and warm up - knee ext machine - 2x10 ea - 5# L LE only - knee flexion machine - 2x15 ea - 25# - leg press - 3x10 @55 # - Wrist ext stretch x45'' - wrist pronation, supination, ext, flexion, ulnar and radial deviation: 3#, 2x10 ea -mini quat UE support - lateral walk red tband 3x47ft - wrist flex/ext with yellow x 60 sec  Past Interventions Not Performed Today: - GTB row - 3x10 with concentration on scapular retraction - GTB shoulder ext - 3x10 - balling up putty, flattening, then extending fingers - 5x - hip abduction machine - 25# - 2x10                               PT Short Term Goals - 10/28/20 1721       PT SHORT TERM GOAL #1   Title Pt will be compliant and knowledeable with initial HEP for improved carryover    Baseline initial HEP given  Time 3    Period Weeks    Status New    Target Date 11/18/20               PT Long Term Goals - 10/28/20 1722       PT LONG TERM GOAL #1   Title Pt will improve FOTO scores to no less than predicted values for both pelvis and forearm as proxy for functional improvement    Baseline see flowsheet    Time 8    Period Weeks    Status New    Target Date 12/23/20      PT LONG TERM GOAL #2   Title Pt will increase reps in 30 Sec STS to no less than 15 in order to improve functional mobility    Baseline 10 reps    Time 8    Period Weeks    Status New    Target Date 12/23/20      PT LONG TERM GOAL #3   Title Pt will self report L wrist pain no greater than 3/10 at worst for improved comfort and functional ability    Baseline 10/10 at worst    Time 8    Period Weeks    Status New    Target Date 12/23/20                    Plan - 11/11/20 1656     Clinical Impression Statement Pt was able to complete all prescribed exercises with no adverse effect or change in baseline pain. Continues to progress well with therpay, showing improved tolerance to execise today. Today we concentrated on quad strengthening, hip strengthening, and wrist ROM and strengthening. PT will continue to progress exercises as able per POC.    PT Treatment/Interventions ADLs/Self Care Home Management;Electrical Stimulation;Cryotherapy;Moist Heat;Gait training;Stair training;Functional mobility training;Therapeutic activities;Therapeutic exercise;Balance training;Neuromuscular re-education;Patient/family education;Manual techniques;Vasopneumatic Device;Taping;Dry needling    PT Next Visit Plan assess SLS with goal if needed; assess resposne to HEP, progress as able    PT Home Exercise Plan Access Code: [email or web address]             Patient will benefit from skilled therapeutic intervention in order to improve the following deficits and impairments:  Decreased activity tolerance, Decreased balance, Decreased endurance, Decreased mobility, Decreased range of motion, Decreased strength, Difficulty walking, Impaired UE functional use, Pain, Hypomobility  Visit Diagnosis: Muscle weakness (generalized)  Pain in left wrist     Problem List Patient Active Problem List   Diagnosis Date Noted   Gastritis and gastroduodenitis    GIB (gastrointestinal bleeding) 09/22/2020   Syncope and collapse 09/22/2020   S/P exploratory laparotomy 09/07/2020   Status post surgery 09/06/2020    Eloy End, PT 11/11/2020, 5:05 PM  [facility] [address] [place], [place], [address] Phone: [phone]   Fax:  [phone]  Name: Warren Reyes MRN: [id number] Date of Birth: July 24, 1995

## 2020-11-13 ENCOUNTER — Other Ambulatory Visit: Payer: Self-pay

## 2020-11-13 ENCOUNTER — Encounter: Payer: Self-pay | Admitting: Physical Therapy

## 2020-11-13 ENCOUNTER — Ambulatory Visit: Payer: No Typology Code available for payment source | Admitting: Physical Therapy

## 2020-11-13 DIAGNOSIS — M6281 Muscle weakness (generalized): Secondary | ICD-10-CM | POA: Diagnosis not present

## 2020-11-13 DIAGNOSIS — M25532 Pain in left wrist: Secondary | ICD-10-CM
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
## 2020-11-18 ENCOUNTER — Other Ambulatory Visit: Payer: Self-pay

## 2020-11-18 ENCOUNTER — Ambulatory Visit: Payer: No Typology Code available for payment source

## 2020-11-18 DIAGNOSIS — M25532 Pain in left wrist: Secondary | ICD-10-CM

## 2020-11-18 DIAGNOSIS — M6281 Muscle weakness (generalized): Secondary | ICD-10-CM | POA: Diagnosis not present

## 2020-11-18 NOTE — Therapy (Signed)
[facility] Outpatient Rehabilitation [facility] [address] [place], [place], [address] Phone: [phone]   Fax:  [phone]  Physical Therapy Treatment  Patient Details  Name: Warren Reyes MRN: [id number] Date of Birth: 1995-07-04 Referring Provider (PT): Myrene Galas, MD   Encounter Date: 11/18/2020   PT End of Session - 11/18/20 1750     Visit Number 5    Number of Visits 17    Date for PT Re-Evaluation 12/23/20    Authorization Type Self Pay - FOTO 6th and 10th    PT Start Time 1750    PT Stop Time 1830    PT Time Calculation (min) 40 min    Activity Tolerance Patient tolerated treatment well;No increased pain    Behavior During Therapy [facility] for tasks assessed/performed             No past medical history on file.  Past Surgical History:  Procedure Laterality Date   BIOPSY  09/23/2020   Procedure: BIOPSY;  Surgeon: Jenel Lucks, MD;  Location: [facility] ENDOSCOPY;  Service: Gastroenterology;;   ESOPHAGOGASTRODUODENOSCOPY N/A 09/23/2020   Procedure: ESOPHAGOGASTRODUODENOSCOPY (EGD);  Surgeon: Jenel Lucks, MD;  Location: [facility] ENDOSCOPY;  Service: Gastroenterology;  Laterality: N/A;   extraction of wisdom teeth      LACERATION REPAIR Left 09/06/2020   Procedure: LACERATION REPAIR;  Surgeon: [name], MD;  Location: [facility] OR;  Service: General;  Laterality: Left;   LAPAROTOMY N/A 09/06/2020   Procedure: EXPLORATORY LAPAROTOMY with Splenectomy;  Surgeon: [name], MD;  Location: [facility] OR;  Service: General;  Laterality: N/A;   ORIF WRIST FRACTURE Left 09/08/2020   Procedure: OPEN REDUCTION INTERNAL FIXATION (ORIF) WRIST FRACTURE;  Surgeon: Myrene Galas, MD;  [facility]: [facility] OR;  Service: Orthopedics;  Laterality: Left;    There were no vitals filed for this visit.   Subjective Assessment - 11/18/20 1750     Subjective Pt presents to PT with no current reports of wrist or LE pain. Has been compliant with HEP with no adverse effect.  Ready to begin PT treatment at this time.    Currently in Pain? No/denies    Pain Score 0-No pain           [facility] Adult PT Treatment/Exercise:   Therapeutic Exercise: - UBE lvl 2.0 x 3 min while taking subjective and warm up alt ea min - knee ext machine - 2x10 ea - 10# L LE only - knee flexion machine - 2x10 ea - 45# - leg press - 3x10 @80 # - wrist pronation, supination, ext, flexion, ulnar and radial deviation: yellow bar 2x10 ea in pronate  - lateral walk red tband 4x7ft - at toes - monster walk red tband 3x27ft - 25lb KB deadlift to 8in block 3x10 - wrist flex/ext with dowel roll up with 5# weight 4x - shoulder ext 20lbs x 20 - bicep curl 13lbs x 20 - wall squat 3x10  - hip abduction/ext machine - 25# - 2x10 ea   Past Interventions Not Performed Today: - balling up putty, flattening, then extending fingers - 5x - Wrist ext stretch x45''                               PT Short Term Goals - 10/28/20 1721       PT SHORT TERM GOAL #1   Title Pt will be compliant and knowledeable with initial HEP for improved carryover    Baseline initial  HEP given    Time 3    Period Weeks    Status New    Target Date 11/18/20               PT Long Term Goals - 10/28/20 1722       PT LONG TERM GOAL #1   Title Pt will improve FOTO scores to no less than predicted values for both pelvis and forearm as proxy for functional improvement    Baseline see flowsheet    Time 8    Period Weeks    Status New    Target Date 12/23/20      PT LONG TERM GOAL #2   Title Pt will increase reps in 30 Sec STS to no less than 15 in order to improve functional mobility    Baseline 10 reps    Time 8    Period Weeks    Status New    Target Date 12/23/20      PT LONG TERM GOAL #3   Title Pt will self report L wrist pain no greater than 3/10 at worst for improved comfort and functional ability    Baseline 10/10 at worst    Time 8    Period Weeks    Status New     Target Date 12/23/20                   Plan - 11/18/20 1753     Clinical Impression Statement Pt was once again able to complete prescribed exercises with no adverse effect or change in baseline pain. Pt is progressing well with therapy overall, with continued improvement in strength and functional activity tolerance noted. Does hvae some continued L wrist pain with resisted pronation/supination and with attempted wall push up. Today's session again focused on improving proximal hip and general UE strengthening to reduce pain and improve function. Will continue to progress as tolerated per POC.    PT Treatment/Interventions ADLs/Self Care Home Management;Electrical Stimulation;Cryotherapy;Moist Heat;Gait training;Stair training;Functional mobility training;Therapeutic activities;Therapeutic exercise;Balance training;Neuromuscular re-education;Patient/family education;Manual techniques;Vasopneumatic Device;Taping;Dry needling    PT Next Visit Plan assess resposne to HEP, progress as able    PT Home Exercise Plan Access Code: [email or web address]             Patient will benefit from skilled therapeutic intervention in order to improve the following deficits and impairments:  Decreased activity tolerance, Decreased balance, Decreased endurance, Decreased mobility, Decreased range of motion, Decreased strength, Difficulty walking, Impaired UE functional use, Pain, Hypomobility  Visit Diagnosis: Muscle weakness (generalized)  Pain in left wrist     Problem List Patient Active Problem List   Diagnosis Date Noted   Gastritis and gastroduodenitis    GIB (gastrointestinal bleeding) 09/22/2020   Syncope and collapse 09/22/2020   S/P exploratory laparotomy 09/07/2020   Status post surgery 09/06/2020    Eloy End, PT 11/18/2020, 6:30 PM  [facility] Out[facility] [address] [place], [place], [address] Phone: [phone]   Fax:   [phone]  Name: Warren Reyes MRN: [id number] Date of Birth: [birth year]

## 2020-11-20 ENCOUNTER — Ambulatory Visit: Payer: No Typology Code available for payment source | Admitting: Physical Therapy

## 2020-11-20 ENCOUNTER — Encounter: Payer: Self-pay | Admitting: Physical Therapy

## 2020-11-20 ENCOUNTER — Other Ambulatory Visit: Payer: Self-pay

## 2020-11-20 DIAGNOSIS — M6281 Muscle weakness (generalized): Secondary | ICD-10-CM | POA: Diagnosis not present

## 2020-11-20 DIAGNOSIS — M25532 Pain in left wrist: Secondary | ICD-10-CM
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
## 2020-11-25 ENCOUNTER — Other Ambulatory Visit: Payer: Self-pay

## 2020-11-25 ENCOUNTER — Ambulatory Visit: Payer: No Typology Code available for payment source

## 2020-11-25 DIAGNOSIS — M6281 Muscle weakness (generalized): Secondary | ICD-10-CM

## 2020-11-25 DIAGNOSIS — M25532 Pain in left wrist: Secondary | ICD-10-CM

## 2020-11-25 NOTE — Therapy (Signed)
[place], [place], [address] Phone: [phone]   Fax:  [phone]  Physical Therapy Treatment/Discharge  Patient Details  Name: Warren Reyes MRN: [id number] Date of Birth: 02-09-1995 Referring Provider (PT): Altamese [place], MD   Encounter Date: 11/25/2020   PT End of Session - 11/25/20 4650     Visit Number 7    Number of Visits 17    Date for PT Re-Evaluation 12/23/20    Authorization Type Self Pay - FOTO 6th and 10th    PT Start Time 3546    PT Stop Time 1648    PT Time Calculation (min) 31 min    Activity Tolerance Patient tolerated treatment well;No increased pain    Behavior During Therapy [facility] for tasks assessed/performed             No past medical history on file.  Past Surgical History:  Procedure Laterality Date   BIOPSY  09/23/2020   Procedure: BIOPSY;  Surgeon: Daryel November, MD;  Location: [facility] ENDOSCOPY;  Service: Gastroenterology;;   ESOPHAGOGASTRODUODENOSCOPY N/A 09/23/2020   Procedure: ESOPHAGOGASTRODUODENOSCOPY (EGD);  Surgeon: Daryel November, MD;  Location: [place];  Service: Gastroenterology;  Laterality: N/A;   extraction of wisdom teeth      LACERATION REPAIR Left 09/06/2020   Procedure: LACERATION REPAIR;  Surgeon: Ralene Ok, MD;  Location: [place];  Service: General;  Laterality: Left;   LAPAROTOMY N/A 09/06/2020   Procedure: EXPLORATORY LAPAROTOMY with Splenectomy;  Surgeon: Ralene Ok, MD;  Location: [place];  Service: General;  Laterality: N/A;   ORIF WRIST FRACTURE Left 09/08/2020   Procedure: OPEN REDUCTION INTERNAL FIXATION (ORIF) WRIST FRACTURE;  Surgeon: Altamese [place], MD;  Location: [place];  Service: Orthopedics;  Laterality: Left;    There were no vitals filed for this visit.   Subjective Assessment - 11/25/20 1617     Subjective Pt presents to PT with no current reports of pain. He states that he is feeling really good over the  last few days and has had no pain except occasionally with work. He did not use his L wirst brace while working, and while he had a little discomfort, he felt it was very manageable. He feels he is ready for discharge at this time.    Currently in Pain? No/denies    Pain Score 0-No pain           [facility] Adult PT Treatment/Exercise:   Therapeutic Exercise: - STS 2x10 - Wrist circumduction x 20 CW/CCW - Wrist flex/ext yellow tband x 10 ea  - lateral walk blue tband 4x79ft - at toes - monster walk blue tband 3x74ft - bridge x 10 - S/L hip abd x 10 L - Prayer x 30 sec - Standing hip abd/ext x 10 blue tband ea     [facility] PT Assessment - 11/25/20 0001       Observation/Other Assessments   Focus on Therapeutic Outcomes (FOTO)  pelvis - 78% function; forearm - 67% function      Sensation   Light Touch Appears Intact      Sit to Stand   Comments 30 Sec STS: 15 reps                                    PT Education - 11/25/20 1658     Education Details HEP and discharge plan    Person(s) Educated  Patient    Methods Explanation;Demonstration;Handout    Comprehension Verbalized understanding;Returned demonstration              PT Short Term Goals - 11/25/20 1620       PT SHORT TERM GOAL #1   Title Pt will be compliant and knowledeable with initial HEP for improved carryover    Baseline initial HEP given    Time 3    Period Weeks    Status Achieved    Target Date 11/18/20               PT Long Term Goals - 11/25/20 1630       PT LONG TERM GOAL #1   Title Pt will improve FOTO scores to no less than predicted values for both pelvis and forearm as proxy for functional improvement    Baseline see flowsheet    Time 8    Period Weeks    Status Partially Met      PT LONG TERM GOAL #2   Title Pt will increase reps in 30 Sec STS to no less than 15 in order to improve functional mobility    Baseline 10 reps; 15 reps on 11/25/20    Time 8     Period Weeks    Status Achieved      PT LONG TERM GOAL #3   Title Pt will self report L wrist pain no greater than 3/10 at worst for improved comfort and functional ability    Baseline 10/10 at worst    Time 8    Period Weeks    Status Achieved                   Plan - 11/25/20 1658     Clinical Impression Statement Pt was able to complete all prescribed exercises and demonstrated knowledge of HEP with no adverse effect. Over the course of PT treatment pt has been able to improve strength and mobility while decreasing pain. He has met all LTGs with exception of FOTO scores however, they are just under the predicted value for the wrist and pelvis. He has not had pain over the last few days and feels like he is in a good place overall. Pt should continue to improve with HEP compliance and is being discharged from skilled services at this time.    PT Treatment/Interventions ADLs/Self Care Home Management;Electrical Stimulation;Cryotherapy;Moist Heat;Gait training;Stair training;Functional mobility training;Therapeutic activities;Therapeutic exercise;Balance training;Neuromuscular re-education;Patient/family education;Manual techniques;Vasopneumatic Device;Taping;Dry needling    PT Home Exercise Plan Access Code: [email or web address]    Consulted and Agree with Plan of Care Patient             Patient will benefit from skilled therapeutic intervention in order to improve the following deficits and impairments:  Decreased activity tolerance, Decreased balance, Decreased endurance, Decreased mobility, Decreased range of motion, Decreased strength, Difficulty walking, Impaired UE functional use, Pain, Hypomobility  Visit Diagnosis: Muscle weakness (generalized)  Pain in left wrist     Problem List Patient Active Problem List   Diagnosis Date Noted   Gastritis and gastroduodenitis    GIB (gastrointestinal bleeding) 09/22/2020   Syncope and collapse 09/22/2020   S/P exploratory  laparotomy 09/07/2020   Status post surgery 09/06/2020    Ward Chatters, PT 11/25/2020, 5:03 PM  [facility] [address] [place], [place], [address] Phone: [phone]   Fax:  [phone]  Name: DAVED MCFANN MRN: [id number] Date of Birth: 27-Jul-1995  PHYSICAL THERAPY  DISCHARGE SUMMARY  Visits from Start of Care: 7  Current functional level related to goals / functional outcomes: See objective and goals   Remaining deficits: See objective and goals   Education / Equipment: HEP   Patient agrees to discharge. Patient goals were  mostly met . Patient is being discharged due to being pleased with the current functional level.

## 2020-11-27 ENCOUNTER — Ambulatory Visit: Payer: No Typology Code available for payment source | Admitting: Physical Therapy

## 2022-04-28 ENCOUNTER — Telehealth: Payer: Self-pay

## 2022-04-28 NOTE — Telephone Encounter (Signed)
Mychart msg sent

## 2024-02-13 ENCOUNTER — Emergency Department (HOSPITAL_BASED_OUTPATIENT_CLINIC_OR_DEPARTMENT_OTHER): Payer: Self-pay

## 2024-02-13 ENCOUNTER — Other Ambulatory Visit (HOSPITAL_BASED_OUTPATIENT_CLINIC_OR_DEPARTMENT_OTHER): Payer: Self-pay

## 2024-02-13 ENCOUNTER — Encounter (HOSPITAL_BASED_OUTPATIENT_CLINIC_OR_DEPARTMENT_OTHER): Payer: Self-pay

## 2024-02-13 ENCOUNTER — Emergency Department (HOSPITAL_BASED_OUTPATIENT_CLINIC_OR_DEPARTMENT_OTHER)
Admission: EM | Admit: 2024-02-13 | Discharge: 2024-02-13 | Disposition: A | Discharge status: Home / Self Care | Payer: Self-pay | Attending: Emergency Medicine | Admitting: Emergency Medicine

## 2024-02-13 ENCOUNTER — Other Ambulatory Visit: Payer: Self-pay

## 2024-02-13 DIAGNOSIS — M79642 Pain in left hand: Secondary | ICD-10-CM | POA: Insufficient documentation

## 2024-02-13 DIAGNOSIS — M79602 Pain in left arm: Secondary | ICD-10-CM | POA: Insufficient documentation

## 2024-02-13 DIAGNOSIS — M25532 Pain in left wrist: Secondary | ICD-10-CM | POA: Insufficient documentation

## 2024-02-13 DIAGNOSIS — R1110 Vomiting, unspecified: Secondary | ICD-10-CM | POA: Insufficient documentation

## 2024-02-13 MED ORDER — ONDANSETRON 4 MG PO TBDP
4.0000 mg | ORAL_TABLET | Freq: Once | ORAL | Status: AC
  Administered 2024-02-13: 4 mg via ORAL
  Filled 2024-02-13: qty 1

## 2024-02-13 MED ORDER — NAPROXEN 500 MG PO TABS: 500.0000 mg | ORAL_TABLET | Freq: Two times a day (BID) | ORAL | 0 refills | Status: DC

## 2024-02-13 MED ORDER — NAPROXEN 500 MG PO TABS
500.0000 mg | ORAL_TABLET | Freq: Two times a day (BID) | ORAL | 0 refills | Status: AC
  Filled 2024-02-13: qty 14, 7d supply, fill #0

## 2024-02-13 MED ORDER — NAPROXEN 250 MG PO TABS
500.0000 mg | ORAL_TABLET | Freq: Once | ORAL | Status: AC
  Administered 2024-02-13: 500 mg via ORAL
  Filled 2024-02-13: qty 2

## 2024-02-13 NOTE — ED Triage Notes (Signed)
 Pt c/o emesis x3 this morning and L hand/forearm pain today.  Pain score 5/10.  Denies abdominal pain and URI symptoms.  Pt previously had surgery on L hand/forearm.  Swelling noted.

## 2024-02-13 NOTE — ED Provider Notes (Signed)
 " [place] EMERGENCY DEPARTMENT AT [facility] Provider Note   CSN: [id number] Arrival date & time: 02/13/24  1027     Patient presents with: Hand Pain and Emesis   Warren Reyes is a 29 y.o. male.   29 y.o male with a PMH of left arm fixation presents to the ED with a chief complaint of left arm pain has been ongoing for the past 3 days.  Prior surgical intervention approximately 4 years ago by Dr. Celena where a plate and screws were placed.  He is endorsing pain especially with any type of movement.  He denies any trauma, no fevers.  He also reports 3 episodes of vomiting, incidentally prior to arriving in the ED, now he states he is hungry.  He has not followed with his hand surgeon in several years.  He has not tried any medication for improvement in symptoms.  No other complaints reported.  The history is provided by the patient.  Hand Pain Pertinent negatives include no chest pain, no abdominal pain and no shortness of breath.  Emesis Associated symptoms: arthralgias   Associated symptoms: no abdominal pain and no fever        Prior to Admission medications  Medication Sig Start Date End Date Taking? Authorizing Provider  naproxen  (NAPROSYN ) 500 MG tablet Take 1 tablet (500 mg total) by mouth 2 (two) times daily for 7 days. 02/13/24 02/20/24 Yes Riot Waterworth, PA-C  bacitracin  ointment Apply topically 2 (two) times daily. Patient not taking: Reported on 09/29/2020 09/10/20   Maczis, Michael M, PA-C  clotrimazole  (LOTRIMIN ) 1 % cream Apply to affected area 2 times daily for at least 4 weeks ( for 1 week until after the lesions have healed) Patient not taking: Reported on 09/29/2020 07/29/16   Pisciotta, Nat, PA-C  docusate sodium  (COLACE) 100 MG capsule Take 1 capsule (100 mg total) by mouth 2 (two) times daily. Patient not taking: No sig reported 09/16/20   Maczis, Michael M, PA-C  methocarbamol  (ROBAXIN ) 500 MG tablet Take 2 tablets (1,000 mg total) by mouth every 8  (eight) hours as needed for muscle spasms. Patient not taking: Reported on 09/29/2020 09/10/20   Maczis, Michael M, PA-C  neomycin-bacitracin -polymyxin (NEOSPORIN) ointment Apply 1 application topically daily as needed for wound care.    [provider]  ondansetron  (ZOFRAN -ODT) 4 MG disintegrating tablet Take 1 tablet (4 mg total) by mouth every 6 (six) hours as needed for nausea. Patient not taking: Reported on 09/29/2020 09/10/20   Maczis, Michael M, PA-C  oxyCODONE  (ROXICODONE ) 5 MG immediate release tablet Take 1 tablet (5 mg total) by mouth every 6 (six) hours as needed for breakthrough pain. 09/10/20   Maczis, Michael M, PA-C  pantoprazole  (PROTONIX ) 40 MG tablet Take 1 tablet (40 mg total) by mouth daily for 28 days. Patient not taking: Reported on 09/29/2020 09/24/20 10/22/20  Briana Elgin LABOR, MD  polyethylene glycol (MIRALAX  / GLYCOLAX ) 17 g packet Take 17 g by mouth 2 (two) times daily. Patient not taking: No sig reported 09/16/20   Maczis, Michael M, PA-C  sucralfate  (CARAFATE ) 1 GM/10ML suspension Take 10 mLs (1 g total) by mouth 4 (four) times daily -  with meals and at bedtime for 7 days. Patient not taking: Reported on 09/29/2020 09/24/20 10/01/20  Briana Elgin LABOR, MD  tretinoin  (RETIN-A ) 0.025 % cream Apply topically at bedtime. Patient not taking: No sig reported 08/29/12   Gwyneth Cough, MD    Allergies: Patient has no known allergies.  Review of Systems  Constitutional:  Negative for fever.  Respiratory:  Negative for shortness of breath.   Cardiovascular:  Negative for chest pain.  Gastrointestinal:  Positive for vomiting. Negative for abdominal pain.  Genitourinary:  Negative for flank pain.  Musculoskeletal:  Positive for arthralgias.  Skin:  Negative for pallor and wound.  All other systems reviewed and are negative.   Updated Vital Signs BP 118/74   Pulse 65   Temp 98.7 F (37.1 C)   Resp 18   Ht 5' 7 (1.702 m)   Wt 55.3 kg   SpO2 98%   BMI 19.11 kg/m    Physical Exam Vitals and nursing note reviewed.  Constitutional:      Appearance: Normal appearance.  HENT:     Head: Normocephalic and atraumatic.     Mouth/Throat:     Mouth: Mucous membranes are moist.  Cardiovascular:     Rate and Rhythm: Normal rate.  Pulmonary:     Effort: Pulmonary effort is normal.  Abdominal:     General: Abdomen is flat.  Musculoskeletal:        General: Tenderness present.     Left wrist: Tenderness present. No deformity, effusion, lacerations, bony tenderness or snuff box tenderness. Normal range of motion. Normal pulse.     Cervical back: Normal range of motion and neck supple.     Comments: 2 radial pulse, sensation is intact, good ROM with some pain.   Skin:    General: Skin is warm and dry.  Neurological:     Mental Status: He is alert and oriented to person, place, and time.     (all labs ordered are listed, but only abnormal results are displayed) Labs Reviewed - No data to display  EKG: None  Radiology: DG Wrist Complete Left Result Date: 02/13/2024 CLINICAL DATA:  Left wrist pain and swelling.  Previous fracture. EXAM: LEFT WRIST - COMPLETE 4 VIEW COMPARISON:  09/08/2020 FINDINGS: Internal fixation plate and screws again seen in the distal radius. Old fracture deformities of the distal radius and ulnar styloid process are seen. No evidence of acute fracture or dislocation. Mild radiocarpal joint osteoarthritis noted. IMPRESSION: No acute findings. Mild radiocarpal osteoarthritis. Old fracture deformities of distal radius and ulnar styloid process. Electronically Signed   By: Norleen DELENA Kil M.D.   On: 02/13/2024 11:50     Procedures   Medications Ordered in the ED  naproxen  (NAPROSYN ) tablet 500 mg (500 mg Oral Given 02/13/24 1214)  ondansetron  (ZOFRAN -ODT) disintegrating tablet 4 mg (4 mg Oral Given 02/13/24 1214)                                    Medical Decision Making Amount and/or Complexity of Data Reviewed Radiology:  ordered.  Risk Prescription drug management.   Patient presents to the ED with chief complaint of left wrist pain has been ongoing for the past 3 days, prior surgery by Dr. Celena 3 to 4 years ago.  Reports that every time he moves his left wrist he feels like there is some shifting around or popping .  He has not tried medication for improvement in symptoms.  Given naproxen  while in the emergency department.  He also tells me incidental of 3 episodes of nonbilious, nonbloody emesis prior to arrival in the ED, now he states he is hungry.  Also given Zofran  ODT.  Examination of his left wrist is unremarkable, he  is neurovascularly intact.  Does have some pain with range of motion.  Xray of the left wrist showed: Mild radiocarpal osteoarthritis.    Old fracture deformities of distal radius and ulnar styloid process.   Discussed the results of x-ray with patient, we also discussed symptomatic treatment.  Will need to follow-up with orthopedist at his earliest convenience. Patient reports continued pain despite naproxen , we discussed a short course of this for home.  He has not had any episodes of vomiting here, given some Zofran  ODT, tolerating p.o. adequately.  Reports that he is hungry therefore will not further expand our workup.  Patient hemodynamically stable for discharge.  Portions of this note were generated with Scientist, clinical (histocompatibility and immunogenetics). Dictation errors may occur despite best attempts at proofreading.   Final diagnoses:  Left wrist pain    ED Discharge Orders          Ordered    naproxen  (NAPROSYN ) 500 MG tablet  2 times daily        02/13/24 1301               Sharlon Pfohl, PA-C 02/13/24 1302  "

## 2024-02-13 NOTE — Discharge Instructions (Signed)
 You were given a short prescription for an anti-inflammatory, please take 1 tablet twice a day with food for the next 7 days.  Please go to an appointment with your surgeon for further evaluation.  Wrist pain.
